# Patient Record
Sex: Male | Born: 1990
Health system: Southern US, Community
[De-identification: ages and names within clinical notes are randomized; demographics above are authoritative.]

## PROBLEM LIST (undated history)

## (undated) DIAGNOSIS — K649 Unspecified hemorrhoids: Secondary | ICD-10-CM

## (undated) HISTORY — PX: OTHER SURGICAL HISTORY: SHX169

## (undated) HISTORY — PX: HIP FRACTURE SURGERY: SHX118

## (undated) HISTORY — DX: Unspecified hemorrhoids: K64.9

---

## 2010-04-11 ENCOUNTER — Encounter: Admission: RE | Admit: 2010-04-11 | Discharge: 2010-04-11 | Payer: Self-pay | Admitting: Emergency Medicine

## 2017-02-22 DIAGNOSIS — S86892A Other injury of other muscle(s) and tendon(s) at lower leg level, left leg, initial encounter: Secondary | ICD-10-CM | POA: Diagnosis not present

## 2017-02-27 ENCOUNTER — Encounter: Payer: Self-pay | Admitting: *Deleted

## 2017-02-27 ENCOUNTER — Telehealth: Payer: Self-pay | Admitting: Physician Assistant

## 2017-02-27 ENCOUNTER — Telehealth: Payer: Self-pay | Admitting: *Deleted

## 2017-02-27 NOTE — Telephone Encounter (Signed)
Made in error

## 2017-02-27 NOTE — Progress Notes (Addendum)
Joseph Burns is a 26 y.o. male here for a new problem.     History of Present Illness:   Clovis Cao, CMA acted as a Neurosurgeon for [employer], [place].  Chief Complaint  Patient presents with  . Establish Care    Blue Cross Blue Shield    Acute Concerns: Elevated blood pressure -- when checking at [facility] several years ago this was normal but reports that his arms got big from working out and they no longer fit into the machine so he doesn't check it anymore, denies swelling in legs, does get frequent headaches since high school (Excedrin Migraine relieves this), no chest pain or SOB Lower back pain -- intermittent, has been going on for some years, bending over can trigger some pain, does do a good portion of back exercises at the gym, denies any numbness or tingling sensation, no issues with bowel movements, the pain does not last long enough for him to have to take anything for the pain Testicle pain -- has been going on for a few years, has a "weird sensation" after ejaculation and intermittent discomfort, no blood or discharge, has been tested for multiple STD's -- all of which have been negative (I reviewed these records from the health department, negative HIV, syphilis, trich, gonorrhea, chlamydia) Ear pressure -- has had some intermittent ear pressure that feels like the "pressure is changing" causes some intermittent discomfort, does not take anything for this, consistently happening daily  Chronic Issues: None  Health Maintenance: Immunizations -- Tdap due today, he is agreeable to this Colonoscopy -- never had, no family hx of colon cancer PSA -- never performed Diet -- tried to eat healthy but it got too expensive, in the past few months he is now eating out more often Caffeine intake -- 1-2 cans per week of caffeinated soda, no coffee or energy drinks Sleep habits -- difficulty falling sleep, gets between 6-7 hours of sleep Exercise -- goes 5-6 times a week to gym, does cardio  and weights Weight -- Weight: 199 lb 3.2 oz (90.4 kg) -- normal Mood -- good   Depression screen PHQ 2/9 02/28/2017  Decreased Interest 0  Down, Depressed, Hopeless 0  PHQ - 2 Score 0   Other providers/specialists: Saw a podiatrist when he was much younger -- has a "low arch" and needed customized insoles when he was in middle or high school  PMHx, SurgHx, SocialHx, Medications, and Allergies were reviewed in the Visit Navigator and updated as appropriate.  Current Medications:   Current Outpatient Prescriptions:  .  fluticasone (FLONASE) 50 MCG/ACT nasal spray, Place 2 sprays into both nostrils daily., Disp: 16 g, Rfl: 1   Review of Systems:   Review of Systems  Constitutional: Negative for chills, fever and weight loss.  HENT: Negative for hearing loss, sinus pain and sore throat.   Eyes: Negative for blurred vision.  Respiratory: Negative for cough and shortness of breath.   Cardiovascular: Negative for chest pain, palpitations and leg swelling.  Gastrointestinal: Negative for blood in stool, constipation, diarrhea, heartburn, nausea and vomiting.  Genitourinary: Negative for dysuria, frequency, hematuria and urgency.       Testicle discomfort  Musculoskeletal: Positive for back pain. Negative for myalgias.  Skin: Negative for rash.  Neurological: Positive for headaches. Negative for dizziness, tingling, sensory change and loss of consciousness.  Endo/Heme/Allergies: Does not bruise/bleed easily.  Psychiatric/Behavioral:       No depression or anxiety    Vitals:   Vitals:  02/28/17 0825 02/28/17 0909  BP: (!) 140/110 (!) 140/100  Pulse: 71   Temp: 97.4 F (36.3 C)   TempSrc: Oral   SpO2: 99%   Weight: 199 lb 3.2 oz (90.4 kg)   Height:  (1.676 m)      Body mass index is 32.15 kg/m.  Physical Exam:   Physical Exam  Constitutional: He appears well-developed. He is cooperative.  Non-toxic appearance. He does not have a sickly appearance. He does not appear  ill. No distress.  HENT:  Head: Normocephalic and atraumatic.  Right Ear: Tympanic membrane normal. Tympanic membrane is not erythematous, not retracted and not bulging.  Left Ear: Tympanic membrane normal. Tympanic membrane is not erythematous, not retracted and not bulging.  Nose: Nose normal.  Mouth/Throat: Uvula is midline and oropharynx is clear and moist.  Eyes: Conjunctivae, EOM and lids are normal. Pupils are equal, round, and reactive to light.  Neck: Trachea normal.  Cardiovascular: Normal rate, regular rhythm, S1 normal, S2 normal, normal heart sounds and normal pulses.   Pulmonary/Chest: Effort normal and breath sounds normal.  Abdominal: Normal appearance and bowel sounds are normal. There is no tenderness.  Genitourinary: Testes normal and penis normal. Right testis shows no swelling and no tenderness. Left testis shows no swelling and no tenderness.  Musculoskeletal:       Lumbar back: He exhibits no tenderness, no bony tenderness, no swelling, no edema and no deformity.  No decrease in ROM of back  Lymphadenopathy:    He has no cervical adenopathy.  Neurological: He is alert.  Skin: Skin is warm, dry and intact.  Psychiatric: He has a normal mood and affect. His speech is normal and behavior is normal. Thought content normal.  Nursing note and vitals reviewed.    Assessment and Plan:    Problem List Items Addressed This Visit      Nervous and Auditory   Dysfunction of eustachian tube    Treat with Flonase per orders. Suspect possible underlying seasonal allergies.        Other   Testicular discomfort    Will obtain ultrasound with doppler given duration of symptoms. Patient would like referral to urology if needed. I told him that we will wait on results and decide what to do. He is agreeable to plan. Will obtain UA and STI screening      Relevant Orders   Urine cytology ancillary only   Urinalysis, Routine w reflex microscopic   [place] Art/Ven Flow Abd Pelv  Doppler   US Scrotum   Elevated blood pressure reading    Follow-up with [place] in 2 weeks for blood pressure re-check. Lipid panel today.      Chronic bilateral low back pain without sciatica    Exam benign. No red flags. Will get lumbar xray to evaluate given duration of symptoms. Consider referral to sports med if patient desires further work up.      Relevant Orders   DG Lumbar Spine 2-3 Views (Completed)   Class 1 obesity due to excess calories with body mass index (BMI) of 32.0 to 32.9 in adult    Discussed healthy diet. Continue exercise and adequate hydration of non-calorie beverages.       Other Visit Diagnoses    Encounter for routine adult medical examination    -  Primary Today patient counseled on age appropriate routine health concerns for screening and prevention, each reviewed and up to date or declined. Immunizations reviewed and up to date or declined.  Labs ordered and reviewed. Risk factors for depression reviewed and negative. Hearing function and visual acuity are intact. ADLs screened and addressed as needed. Functional ability and level of safety reviewed and appropriate. Education, counseling and referrals performed based on assessed risks today. Patient provided with a copy of personalized plan for preventive services.   Relevant Orders   Comprehensive metabolic panel   CBC with Differential/Platelet   Lipid panel   Need for Tdap vaccination       Relevant Orders   Tdap vaccine greater than or equal to 7yo IM (Completed)      . Reviewed expectations re: course of current medical issues. . Discussed self-management of symptoms. . Outlined signs and symptoms indicating need for more acute intervention. . Patient verbalized understanding and all questions were answered. . See orders for this visit as documented in the electronic medical record. . Patient received an After-Visit Summary.  CMA served as Neurosurgeon during this visit. History, Physical, and Plan  performed by medical provider. Documentation and orders reviewed and attested to.   Jarold Motto, PA-C

## 2017-02-27 NOTE — Progress Notes (Signed)
Pre visit review using our clinic review tool, if applicable. No additional management support is needed unless otherwise documented below in the visit note. 

## 2017-02-27 NOTE — Telephone Encounter (Signed)
PreVisit Call completed. No acute complaints. Believes last Tdap was 2008. Did not receive flu vaccine.

## 2017-02-28 ENCOUNTER — Ambulatory Visit (INDEPENDENT_AMBULATORY_CARE_PROVIDER_SITE_OTHER): Payer: BLUE CROSS/BLUE SHIELD | Admitting: Physician Assistant

## 2017-02-28 ENCOUNTER — Other Ambulatory Visit (HOSPITAL_COMMUNITY)
Admission: RE | Admit: 2017-02-28 | Discharge: 2017-02-28 | Disposition: A | Discharge status: Home / Self Care | Payer: BLUE CROSS/BLUE SHIELD | Source: Ambulatory Visit | Attending: Physician Assistant | Admitting: Physician Assistant

## 2017-02-28 ENCOUNTER — Encounter: Payer: Self-pay | Admitting: Physician Assistant

## 2017-02-28 ENCOUNTER — Ambulatory Visit (INDEPENDENT_AMBULATORY_CARE_PROVIDER_SITE_OTHER): Payer: BLUE CROSS/BLUE SHIELD

## 2017-02-28 VITALS — BP 140/100 | HR 71 | Temp 97.4°F | Ht 66.0 in | Wt 199.2 lb

## 2017-02-28 DIAGNOSIS — N50819 Testicular pain, unspecified: Secondary | ICD-10-CM | POA: Diagnosis not present

## 2017-02-28 DIAGNOSIS — G8929 Other chronic pain: Secondary | ICD-10-CM

## 2017-02-28 DIAGNOSIS — R030 Elevated blood-pressure reading, without diagnosis of hypertension: Secondary | ICD-10-CM

## 2017-02-28 DIAGNOSIS — Z113 Encounter for screening for infections with a predominantly sexual mode of transmission: Secondary | ICD-10-CM | POA: Diagnosis not present

## 2017-02-28 DIAGNOSIS — M545 Low back pain, unspecified: Secondary | ICD-10-CM | POA: Insufficient documentation

## 2017-02-28 DIAGNOSIS — Z0000 Encounter for general adult medical examination without abnormal findings: Secondary | ICD-10-CM | POA: Diagnosis not present

## 2017-02-28 DIAGNOSIS — Z6832 Body mass index (BMI) 32.0-32.9, adult: Secondary | ICD-10-CM | POA: Diagnosis not present

## 2017-02-28 DIAGNOSIS — H6980 Other specified disorders of Eustachian tube, unspecified ear: Secondary | ICD-10-CM | POA: Diagnosis not present

## 2017-02-28 DIAGNOSIS — Z23 Encounter for immunization: Secondary | ICD-10-CM | POA: Diagnosis not present

## 2017-02-28 DIAGNOSIS — E6609 Other obesity due to excess calories: Secondary | ICD-10-CM

## 2017-02-28 DIAGNOSIS — M48061 Spinal stenosis, lumbar region without neurogenic claudication: Secondary | ICD-10-CM | POA: Diagnosis not present

## 2017-02-28 LAB — COMPREHENSIVE METABOLIC PANEL
ALT: 31 U/L (ref 0–53)
AST: 28 U/L (ref 0–37)
Albumin: 4.8 g/dL (ref 3.5–5.2)
Alkaline Phosphatase: 52 U/L (ref 39–117)
BUN: 17 mg/dL (ref 6–23)
CO2: 29 mEq/L (ref 19–32)
Calcium: 9.4 mg/dL (ref 8.4–10.5)
Chloride: 104 mEq/L (ref 96–112)
Creatinine, Ser: 1.02 mg/dL (ref 0.40–1.50)
GFR: 112.1 mL/min (ref >60.00)
Glucose, Bld: 105 mg/dL — ABNORMAL HIGH (ref 70–99)
Potassium: 3.8 mEq/L (ref 3.5–5.1)
Sodium: 138 mEq/L (ref 135–145)
Total Bilirubin: 0.5 mg/dL (ref 0.2–1.2)
Total Protein: 7.9 g/dL (ref 6.0–8.3)

## 2017-02-28 LAB — CBC WITH DIFFERENTIAL/PLATELET
Basophils Absolute: 0 10*3/uL (ref 0.0–0.1)
Basophils Relative: 0.8 % (ref 0.0–3.0)
Eosinophils Absolute: 0.2 10*3/uL (ref 0.0–0.7)
Eosinophils Relative: 3.8 % (ref 0.0–5.0)
HCT: 46.6 % (ref 39.0–52.0)
Hemoglobin: 15.4 g/dL (ref 13.0–17.0)
Lymphocytes Relative: 30.5 % (ref 12.0–46.0)
Lymphs Abs: 1.7 10*3/uL (ref 0.7–4.0)
MCHC: 33 g/dL (ref 30.0–36.0)
MCV: 81.8 fl (ref 78.0–100.0)
Monocytes Absolute: 0.6 10*3/uL (ref 0.1–1.0)
Monocytes Relative: 11.4 % (ref 3.0–12.0)
Neutro Abs: 3 10*3/uL (ref 1.4–7.7)
Neutrophils Relative %: 53.5 % (ref 43.0–77.0)
Platelets: 177 10*3/uL (ref 150.0–400.0)
RBC: 5.7 Mil/uL (ref 4.22–5.81)
RDW: 14.1 % (ref 11.5–15.5)
WBC: 5.5 10*3/uL (ref 4.0–10.5)

## 2017-02-28 LAB — URINALYSIS, ROUTINE W REFLEX MICROSCOPIC
Bilirubin Urine: NEGATIVE
Hgb urine dipstick: NEGATIVE
Ketones, ur: NEGATIVE
Leukocytes, UA: NEGATIVE
Nitrite: NEGATIVE
RBC / HPF: NONE SEEN (ref 0–?)
Specific Gravity, Urine: >=1.03 — AB (ref 1.000–1.030)
Total Protein, Urine: NEGATIVE
Urine Glucose: NEGATIVE
Urobilinogen, UA: 0.2 (ref 0.0–1.0)
WBC, UA: NONE SEEN (ref 0–?)
pH: 5.5 (ref 5.0–8.0)

## 2017-02-28 LAB — LIPID PANEL
Cholesterol: 175 mg/dL (ref 0–200)
HDL: 56.4 mg/dL (ref >39.00)
LDL Cholesterol: 106 mg/dL — ABNORMAL HIGH (ref 0–99)
NonHDL: 118.6
Total CHOL/HDL Ratio: 3
Triglycerides: 63 mg/dL (ref 0.0–149.0)
VLDL: 12.6 mg/dL (ref 0.0–40.0)

## 2017-02-28 MED ORDER — FLUTICASONE PROPIONATE 50 MCG/ACT NA SUSP: 2.0000 | Freq: Every day | NASAL | 1 refills | Status: DC

## 2017-02-28 NOTE — Patient Instructions (Addendum)
It was great meeting you today!  You may use the flonase to help with your ear pressure.   We will call you with your lab, urine and xray results.  You will be contacted about your testicular ultrasound. We will wait for the results before we refer you to urology.   Health Maintenance, Male A healthy lifestyle and preventive care is important for your health and wellness. Ask your health care provider about what schedule of regular examinations is right for you. What should I know about weight and diet?  Eat a Healthy Diet  Eat plenty of vegetables, fruits, whole grains, low-fat dairy products, and lean protein.  Do not eat a lot of foods high in solid fats, added sugars, or salt. Maintain a Healthy Weight  Regular exercise can help you achieve or maintain a healthy weight. You should:  Do at least 150 minutes of exercise each week. The exercise should increase your heart rate and make you sweat (moderate-intensity exercise).  Do strength-training exercises at least twice a week. Watch Your Levels of Cholesterol and Blood Lipids  Have your blood tested for lipids and cholesterol every 5 years starting at 28 years of age. If you are at high risk for heart disease, you should start having your blood tested when you are 28 years old. You may need to have your cholesterol levels checked more often if:  Your lipid or cholesterol levels are high.  You are older than 28 years of age.  You are at high risk for heart disease. What should I know about cancer screening? Many types of cancers can be detected early and may often be prevented. Lung Cancer  You should be screened every year for lung cancer if:  You are a current smoker who has smoked for at least 30 years.  You are a former smoker who has quit within the past 15 years.  Talk to your health care provider about your screening options, when you should start screening, and how often you should be screened. Colorectal  Cancer  Routine colorectal cancer screening usually begins at 28 years of age and should be repeated every 5-10 years until you are 28 years old. You may need to be screened more often if early forms of precancerous polyps or small growths are found. Your health care provider may recommend screening at an earlier age if you have risk factors for colon cancer.  Your health care provider may recommend using home test kits to check for hidden blood in the stool.  A small camera at the end of a tube can be used to examine your colon (sigmoidoscopy or colonoscopy). This checks for the earliest forms of colorectal cancer. Prostate and Testicular Cancer  Depending on your age and overall health, your health care provider may do certain tests to screen for prostate and testicular cancer.  Talk to your health care provider about any symptoms or concerns you have about testicular or prostate cancer. Skin Cancer  Check your skin from head to toe regularly.  Tell your health care provider about any new moles or changes in moles, especially if:  There is a change in a mole's size, shape, or color.  You have a mole that is larger than a pencil eraser.  Always use sunscreen. Apply sunscreen liberally and repeat throughout the day.  Protect yourself by wearing long sleeves, pants, a wide-brimmed hat, and sunglasses when outside. What should I know about heart disease, diabetes, and high blood pressure?  If  you are 35-68 years of age, have your blood pressure checked every 3-5 years. If you are 24 years of age or older, have your blood pressure checked every year. You should have your blood pressure measured twice-once when you are at a hospital or clinic, and once when you are not at a hospital or clinic. Record the average of the two measurements. To check your blood pressure when you are not at a hospital or clinic, you can use:  An automated blood pressure machine at a pharmacy.  A home blood  pressure monitor.  Talk to your health care provider about your target blood pressure.  If you are between 68-75 years old, ask your health care provider if you should take aspirin to prevent heart disease.  Have regular diabetes screenings by checking your fasting blood sugar level.  If you are at a normal weight and have a low risk for diabetes, have this test once every three years after the age of 1.  If you are overweight and have a high risk for diabetes, consider being tested at a younger age or more often.  A one-time screening for abdominal aortic aneurysm (AAA) by ultrasound is recommended for men aged 65-75 years who are current or former smokers. What should I know about preventing infection? Hepatitis B  If you have a higher risk for hepatitis B, you should be screened for this virus. Talk with your health care provider to find out if you are at risk for hepatitis B infection. Hepatitis C  Blood testing is recommended for:  Everyone born from [birth year] through 1967.  Anyone with known risk factors for hepatitis C. Sexually Transmitted Diseases (STDs)  You should be screened each year for STDs including gonorrhea and chlamydia if:  You are sexually active and are younger than 28 years of age.  You are older than 28 years of age and your health care provider tells you that you are at risk for this type of infection.  Your sexual activity has changed since you were last screened and you are at an increased risk for chlamydia or gonorrhea. Ask your health care provider if you are at risk.  Talk with your health care provider about whether you are at high risk of being infected with HIV. Your health care provider may recommend a prescription medicine to help prevent HIV infection. What else can I do?  =Schedule regular health, dental, and eye exams.  Stay current with your vaccines (immunizations).  Do not use any tobacco products, such as cigarettes, chewing tobacco, and  e-cigarettes. If you need help quitting, ask your health care provider.  Limit alcohol intake to no more than 2 drinks per day. One drink equals 12 ounces of beer, 5 ounces of wine, or 1 ounces of hard liquor.  Do not use street drugs.  Do not share needles.  Ask your health care provider for help if you need support or information about quitting drugs.  Tell your health care provider if you often feel depressed.  Tell your health care provider if you have ever been abused or do not feel safe at home. This information is not intended to replace advice given to you by your health care provider. Make sure you discuss any questions you have with your health care provider. Document Released: 05/10/2008 Document Revised: 07/11/2016 Document Reviewed: 08/16/2015 Elsevier Interactive Patient Education  2017 [employer]   Eustachian Tube Dysfunction The eustachian tube connects the middle ear to the back of  the nose. It regulates air pressure in the middle ear by allowing air to move between the ear and nose. It also helps to drain fluid from the middle ear space. When the eustachian tube does not function properly, air pressure, fluid, or both can build up in the middle ear. Eustachian tube dysfunction can affect one or both ears. What are the causes? This condition happens when the eustachian tube becomes blocked or cannot open normally. This may result from:  Ear infections.  Colds and other upper respiratory infections.  Allergies.  Irritation, such as from cigarette smoke or acid from the stomach coming up into the esophagus (gastroesophageal reflux).  Sudden changes in air pressure, such as from descending in an airplane.  Abnormal growths in the nose or throat, such as nasal polyps, tumors, or enlarged tissue at the back of the throat (adenoids). What increases the risk? This condition may be more likely to develop in people who smoke and people who are overweight. Eustachian  tube dysfunction may also be more likely to develop in children, especially children who have:  Certain birth defects of the mouth, such as cleft palate.  Large tonsils and adenoids. What are the signs or symptoms? Symptoms of this condition may include:  A feeling of fullness in the ear.  Ear pain.  Clicking or popping noises in the ear.  Ringing in the ear.  Hearing loss.  Loss of balance. Symptoms may get worse when the air pressure around you changes, such as when you travel to an area of high elevation or fly on an airplane. How is this diagnosed? This condition may be diagnosed based on:  Your symptoms.  A physical exam of your ear, nose, and throat.  Tests, such as those that measure:  The movement of your eardrum (tympanogram).  Your hearing (audiometry). How is this treated? Treatment depends on the cause and severity of your condition. If your symptoms are mild, you may be able to relieve your symptoms by moving air into ("popping") your ears. If you have symptoms of fluid in your ears, treatment may include:  Decongestants.  Antihistamines.  Nasal sprays or ear drops that contain medicines that reduce swelling (steroids). In some cases, you may need to have a procedure to drain the fluid in your eardrum (myringotomy). In this procedure, a small tube is placed in the eardrum to:  Drain the fluid.  Restore the air in the middle ear space. Follow these instructions at home:  Take over-the-counter and prescription medicines only as told by your health care provider.  Use techniques to help pop your ears as recommended by your health care provider. These may include:  Chewing gum.  Yawning.  Frequent, forceful swallowing.  Closing your mouth, holding your nose closed, and gently blowing as if you are trying to blow air out of your nose.  Do not do any of the following until your health care provider approves:  Travel to high altitudes.  Fly in  airplanes.  Work in a Estate agent or room.  Scuba dive.  Keep your ears dry. Dry your ears completely after showering or bathing.  Do not smoke.  Keep all follow-up visits as told by your health care provider. This is important. Contact a health care provider if:  Your symptoms do not go away after treatment.  Your symptoms come back after treatment.  You are unable to pop your ears.  You have:  A fever.  Pain in your ear.  Pain in  your head or neck.  Fluid draining from your ear.  Your hearing suddenly changes.  You become very dizzy.  You lose your balance. This information is not intended to replace advice given to you by your health care provider. Make sure you discuss any questions you have with your health care provider. Document Released: 12/09/2015 Document Revised: 04/19/2016 Document Reviewed: 12/01/2014 Elsevier Interactive Patient Education  2017 [employer].

## 2017-02-28 NOTE — Assessment & Plan Note (Signed)
Treat with Flonase per orders. Suspect possible underlying seasonal allergies.

## 2017-02-28 NOTE — Assessment & Plan Note (Signed)
Discussed healthy diet. Continue exercise and adequate hydration of non-calorie beverages.

## 2017-02-28 NOTE — Assessment & Plan Note (Signed)
Follow-up with [place] in 2 weeks for blood pressure re-check.

## 2017-02-28 NOTE — Assessment & Plan Note (Addendum)
Will obtain ultrasound with doppler given duration of symptoms. Patient would like referral to urology if needed. I told him that we will wait on results and decide what to do. He is agreeable to plan. Will obtain UA and STI screening

## 2017-02-28 NOTE — Assessment & Plan Note (Signed)
Exam benign. No red flags. Will get lumbar xray to evaluate given duration of symptoms. Consider referral to sports med if patient desires further work up.

## 2017-03-01 ENCOUNTER — Telehealth: Payer: Self-pay | Admitting: Physician Assistant

## 2017-03-01 LAB — URINE CYTOLOGY ANCILLARY ONLY
Chlamydia: NEGATIVE
Neisseria Gonorrhea: NEGATIVE
Trichomonas: NEGATIVE

## 2017-03-01 NOTE — Telephone Encounter (Signed)
Patient tried returning phone call. Informed you would give a return call back at your convenience.

## 2017-03-04 NOTE — Telephone Encounter (Signed)
Pt aware of results, see result note. 

## 2017-03-05 ENCOUNTER — Ambulatory Visit
Admission: RE | Admit: 2017-03-05 | Discharge: 2017-03-05 | Disposition: A | Discharge status: Home / Self Care | Payer: BLUE CROSS/BLUE SHIELD | Source: Ambulatory Visit | Attending: Physician Assistant | Admitting: Physician Assistant

## 2017-03-05 DIAGNOSIS — N5089 Other specified disorders of the male genital organs: Secondary | ICD-10-CM | POA: Diagnosis not present

## 2017-03-05 DIAGNOSIS — N50819 Testicular pain, unspecified: Secondary | ICD-10-CM

## 2017-03-07 ENCOUNTER — Other Ambulatory Visit: Payer: Self-pay | Admitting: Physician Assistant

## 2017-03-07 DIAGNOSIS — N50819 Testicular pain, unspecified: Secondary | ICD-10-CM

## 2017-03-07 NOTE — Progress Notes (Signed)
Order for referral to Urology placed.

## 2017-03-13 ENCOUNTER — Other Ambulatory Visit: Payer: Self-pay

## 2017-03-14 ENCOUNTER — Encounter: Payer: Self-pay | Admitting: Physician Assistant

## 2017-03-14 ENCOUNTER — Ambulatory Visit (INDEPENDENT_AMBULATORY_CARE_PROVIDER_SITE_OTHER): Payer: BLUE CROSS/BLUE SHIELD | Admitting: Physician Assistant

## 2017-03-14 ENCOUNTER — Encounter: Payer: BLUE CROSS/BLUE SHIELD | Admitting: Physician Assistant

## 2017-03-14 VITALS — BP 140/96 | HR 80 | Temp 98.7°F | Ht 66.0 in | Wt 200.5 lb

## 2017-03-14 DIAGNOSIS — R030 Elevated blood-pressure reading, without diagnosis of hypertension: Secondary | ICD-10-CM | POA: Diagnosis not present

## 2017-03-14 NOTE — Progress Notes (Signed)
Pre visit review using our clinic review tool, if applicable. No additional management support is needed unless otherwise documented below in the visit note. 

## 2017-03-14 NOTE — Progress Notes (Signed)
Joseph Burns is a 26 y.o. male is here to follow up on elevated blood pressure.  I acted as a Neurosurgeon for [employer], PA-C Corky Mull, LPN  History of Present Illness:   Chief Complaint  Patient presents with  . Follow-up  . Hypertension    HPI   Patient reports that he has not been able to check his blood pressure consistently and has no records for me to review. He has started to make some diet changes. He denies frequent HA, SOB, lower leg swelling, blurry vision, or chest pain. He very rarely drinks caffeine. Does have occasional work stress but nothing new or significant.  BP Readings from Last 3 Encounters:  03/14/17 136/90  02/28/17 (!) 140/100     There are no preventive care reminders to display for this patient.  PMHx, SurgHx, SocialHx, FamHx, Medications, and Allergies were reviewed in the Visit Navigator and updated as appropriate.   Patient Active Problem List   Diagnosis Date Noted  . Testicular discomfort 02/28/2017  . Elevated blood pressure reading 02/28/2017  . Chronic bilateral low back pain without sciatica 02/28/2017  . Dysfunction of eustachian tube 02/28/2017  . Class 1 obesity due to excess calories with body mass index (BMI) of 32.0 to 32.9 in adult 02/28/2017    Social History  Substance Use Topics  . Smoking status: Never Smoker  . Smokeless tobacco: Never Used  . Alcohol use Yes     Comment: 6 cans of beer weekly on average    Current Medications and Allergies:    Current Outpatient Prescriptions:  .  fluticasone (FLONASE) 50 MCG/ACT nasal spray, Place 2 sprays into both nostrils daily., Disp: 16 g, Rfl: 1  No Known Allergies  Review of Systems   Review of Systems  Constitutional: Negative.   HENT: Positive for congestion.   Eyes: Negative.   Respiratory: Positive for cough and sputum production.   Cardiovascular: Negative.   Gastrointestinal: Negative.   Genitourinary: Negative.   Musculoskeletal: Negative.     Skin: Negative.   Neurological: Positive for headaches.  Endo/Heme/Allergies: Negative.   Psychiatric/Behavioral: Negative.     Vitals:   Vitals:   03/14/17 1548  BP: 136/90  Pulse: 80  Temp: 98.7 F (37.1 C)  TempSrc: Oral  SpO2: 98%  Weight: 200 lb 8 oz (90.9 kg)  Height:  (1.676 m)     Body mass index is 32.36 kg/m.   Physical Exam:    Physical Exam  Constitutional: He appears well-developed. He is cooperative.  Non-toxic appearance. He does not have a sickly appearance. He does not appear ill. No distress.  Cardiovascular: Normal rate, regular rhythm, S1 normal, S2 normal, normal heart sounds and normal pulses.   No LE edema  Pulmonary/Chest: Effort normal and breath sounds normal.  Neurological: He is alert.  Nursing note and vitals reviewed.      Assessment and Plan:    Problem List Items Addressed This Visit      Other   Elevated blood pressure reading - Primary    Patient is reluctant to go on medications. We briefly discussed starting Norvasc 5 mg, however he declined today. He would like to try to eat healthier and work on lifestyle changes for about 6 weeks instead of starting medication. I advised him that I would like for him to keep a record of his BP for [place] to review at follow-up.          . Reviewed expectations re: course  of current medical issues. . Discussed self-management of symptoms. . Outlined signs and symptoms indicating need for more acute intervention. . Patient verbalized understanding and all questions were answered. . See orders for this visit as documented in the electronic medical record. . Patient received an After Visit Summary.  CMA or LPN served as scribe during this visit. History, Physical, and Plan performed by medical provider. Documentation and orders reviewed and attested to.  Jarold Motto, PA-C [place] [place] 03/14/2017  Follow-up: Return in about 6 weeks (around 04/25/2017) for BP  follow-up.

## 2017-03-14 NOTE — Patient Instructions (Signed)
Heart-Healthy Eating Plan °Heart-healthy meal planning includes: °· Limiting unhealthy fats. °· Increasing healthy fats. °· Making other small dietary changes. °You may need to talk with your doctor or a diet specialist (dietitian) to create an eating plan that is right for you. °What types of fat should I choose? °· Choose healthy fats. These include olive oil and canola oil, flaxseeds, walnuts, almonds, and seeds. °· Eat more omega-3 fats. These include salmon, mackerel, sardines, tuna, flaxseed oil, and ground flaxseeds. Try to eat fish at least twice each week. °· Limit saturated fats. °¨ Saturated fats are often found in animal products, such as meats, butter, and cream. °¨ Plant sources of saturated fats include palm oil, palm kernel oil, and coconut oil. °· Avoid foods with partially hydrogenated oils in them. These include stick margarine, some tub margarines, cookies, crackers, and other baked goods. These contain trans fats. °What general guidelines do I need to follow? °· Check food labels carefully. Identify foods with trans fats or high amounts of saturated fat. °· Fill one half of your plate with vegetables and green salads. Eat 4-5 servings of vegetables per day. A serving of vegetables is: °¨ 1 cup of raw leafy vegetables. °¨ ½ cup of raw or cooked cut-up vegetables. °¨ ½ cup of vegetable juice. °· Fill one fourth of your plate with whole grains. Look for the word "whole" as the first word in the ingredient list. °· Fill one fourth of your plate with lean protein foods. °· Eat 4-5 servings of fruit per day. A serving of fruit is: °¨ One medium whole fruit. °¨ ¼ cup of dried fruit. °¨ ½ cup of fresh, frozen, or canned fruit. °¨ ½ cup of 100% fruit juice. °· Eat more foods that contain soluble fiber. These include apples, broccoli, carrots, beans, peas, and barley. Try to get 20-30 g of fiber per day. °· Eat more home-cooked food. Eat less restaurant, buffet, and fast food. °· Limit or avoid  alcohol. °· Limit foods high in starch and sugar. °· Avoid fried foods. °· Avoid frying your food. Try baking, boiling, grilling, or broiling it instead. You can also reduce fat by: °¨ Removing the skin from poultry. °¨ Removing all visible fats from meats. °¨ Skimming the fat off of stews, soups, and gravies before serving them. °¨ Steaming vegetables in water or broth. °· Lose weight if you are overweight. °· Eat 4-5 servings of nuts, legumes, and seeds per week: °¨ One serving of dried beans or legumes equals ½ cup after being cooked. °¨ One serving of nuts equals 1½ ounces. °¨ One serving of seeds equals ½ ounce or one tablespoon. °· You may need to keep track of how much salt or sodium you eat. This is especially true if you have high blood pressure. Talk with your doctor or dietitian to get more information. °What foods can I eat? °Grains  °Breads, including French, white, pita, wheat, raisin, rye, oatmeal, and Italian. Tortillas that are neither fried nor made with lard or trans fat. Low-fat rolls, including hotdog and hamburger buns and English muffins. Biscuits. Muffins. Waffles. Pancakes. Light popcorn. Whole-grain cereals. Flatbread. Melba toast. Pretzels. Breadsticks. Rusks. Low-fat snacks. Low-fat crackers, including oyster, saltine, matzo, graham, animal, and rye. Rice and pasta, including brown rice and pastas that are made with whole wheat. °Vegetables  °All vegetables. °Fruits  °All fruits, but limit coconut. °Meats and Other Protein Sources  °Lean, well-trimmed beef, veal, pork, and lamb. Chicken and turkey without skin. All   fish and shellfish. Wild duck, rabbit, pheasant, and venison. Egg whites or low-cholesterol egg substitutes. Dried beans, peas, lentils, and tofu. Seeds and most nuts. °Dairy  °Low-fat or nonfat cheeses, including ricotta, string, and mozzarella. Skim or 1% milk that is liquid, powdered, or evaporated. Buttermilk that is made with low-fat milk. Nonfat or low-fat  yogurt. °Beverages  °Mineral water. Diet carbonated beverages. °Sweets and Desserts  °Sherbets and fruit ices. Honey, jam, marmalade, jelly, and syrups. Meringues and gelatins. Pure sugar candy, such as hard candy, jelly beans, gumdrops, mints, marshmallows, and small amounts of dark chocolate. Angel food cake. °Eat all sweets and desserts in moderation. °Fats and Oils  °Nonhydrogenated (trans-free) margarines. Vegetable oils, including soybean, sesame, sunflower, olive, peanut, safflower, corn, canola, and cottonseed. Salad dressings or mayonnaise made with a vegetable oil. Limit added fats and oils that you use for cooking, baking, salads, and as spreads. °Other  °Cocoa powder. Coffee and tea. All seasonings and condiments. °The items listed above may not be a complete list of recommended foods or beverages. Contact your dietitian for more options.  °What foods are not recommended? °Grains  °Breads that are made with saturated or trans fats, oils, or whole milk. Croissants. Butter rolls. Cheese breads. Sweet rolls. Donuts. Buttered popcorn. Chow mein noodles. High-fat crackers, such as cheese or butter crackers. °Meats and Other Protein Sources  °Fatty meats, such as hotdogs, short ribs, sausage, spareribs, bacon, rib eye roast or steak, and mutton. High-fat deli meats, such as salami and bologna. Caviar. Domestic duck and goose. Organ meats, such as kidney, liver, sweetbreads, and heart. °Dairy  °Cream, sour cream, cream cheese, and creamed cottage cheese. Whole-milk cheeses, including blue (bleu), Monterey Jack, Brie, [place], American, Havarti, Swiss, cheddar, Camembert, and [place]. Whole or 2% milk that is liquid, evaporated, or condensed. Whole buttermilk. Cream sauce or high-fat cheese sauce. Yogurt that is made from whole milk. °Beverages  °Regular sodas and juice drinks with added sugar. °Sweets and Desserts  °Frosting. Pudding. Cookies. Cakes other than angel food cake. Candy that has milk chocolate or  white chocolate, hydrogenated fat, butter, coconut, or unknown ingredients. Buttered syrups. Full-fat ice cream or ice cream drinks. °Fats and Oils  °Gravy that has suet, meat fat, or shortening. Cocoa butter, hydrogenated oils, palm oil, coconut oil, palm kernel oil. These can often be found in baked products, candy, fried foods, nondairy creamers, and whipped toppings. Solid fats and shortenings, including bacon fat, salt pork, lard, and butter. Nondairy cream substitutes, such as coffee creamers and sour cream substitutes. Salad dressings that are made of unknown oils, cheese, or sour cream. °The items listed above may not be a complete list of foods and beverages to avoid. Contact your dietitian for more information.  °This information is not intended to replace advice given to you by your health care provider. Make sure you discuss any questions you have with your health care provider. °Document Released: 05/13/2012 Document Revised: 04/19/2016 Document Reviewed: 05/06/2014 °Elsevier Interactive Patient Education © 2017 Else[employer] Inc. ° °

## 2017-03-14 NOTE — Assessment & Plan Note (Signed)
Patient is reluctant to go on medications. We briefly discussed starting Norvasc 5 mg, however he declined today. He would like to try to eat healthier and work on lifestyle changes for about 6 weeks instead of starting medication. I advised him that I would like for him to keep a record of his BP for [place] to review at follow-up.

## 2017-04-24 NOTE — Progress Notes (Deleted)
   Fara OldenKenneth Howatt is a 26 y.o. male is here for 6 week follow up on Elevated blood pressure.  I acted as a Neurosurgeonscribe for [employer] CorporationSamantha Worley, PA-C [employer] Avabella Wailes, LPN  History of Present Illness:   No chief complaint on file.   HPI  There are no preventive care reminders to display for this patient.  PMHx, SurgHx, SocialHx, FamHx, Medications, and Allergies were reviewed in the Visit Navigator and updated as appropriate.   Patient Active Problem List   Diagnosis Date Noted  . Testicular discomfort 02/28/2017  . Elevated blood pressure reading 02/28/2017  . Chronic bilateral low back pain without sciatica 02/28/2017  . Dysfunction of eustachian tube 02/28/2017  . Class 1 obesity due to excess calories with body mass index (BMI) of 32.0 to 32.9 in adult 02/28/2017    Social History  Substance Use Topics  . Smoking status: Never Smoker  . Smokeless tobacco: Never Used  . Alcohol use Yes     Comment: 6 cans of beer weekly on average    Current Medications and Allergies:    Current Outpatient Prescriptions:  .  fluticasone (FLONASE) 50 MCG/ACT nasal spray, Place 2 sprays into both nostrils daily., Disp: 16 g, Rfl: 1  No Known Allergies  Review of Systems   ROS  Vitals:  There were no vitals filed for this visit.   There is no height or weight on file to calculate BMI.   Physical Exam:    Physical Exam   Assessment and Plan:    There are no diagnoses linked to this encounter.  . Reviewed expectations re: course of current medical issues. . Discussed self-management of symptoms. . Outlined signs and symptoms indicating need for more acute intervention. . Patient verbalized understanding and all questions were answered. . See orders for this visit as documented in the electronic medical record. . Patient received an After Visit Summary.  CMA or LPN served as scribe during this visit. History, Physical, and Plan performed by medical provider. Documentation and orders  reviewed and attested to.  Jarold MottoSamantha Worley, PA-C [place], [facility] 04/24/2017  Follow-up: No Follow-up on file.

## 2017-04-25 ENCOUNTER — Ambulatory Visit: Payer: BLUE CROSS/BLUE SHIELD | Admitting: Physician Assistant

## 2017-12-17 IMAGING — US US ART/VEN ABD/PELV/SCROTUM DOPPLER LTD
1 series · 14 of 25 positions shown · non-contrast
Comparison: None.

CLINICAL DATA: Initial evaluation for testicular discomfort for 2-3
years.

EXAM:
ULTRASOUND OF SCROTUM
TECHNIQUE: Complete ultrasound examination of the testicles, epididymis, and
other scrotal structures was performed.

[Series 1: us art/ven abd/pelv/scrotum doppler ltd · 0.08mm/px · 14 of 38 slices shown]
[im 1/38]
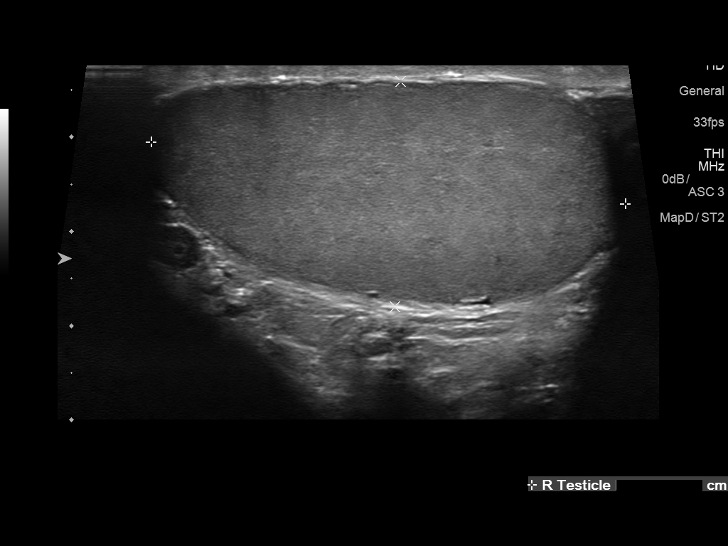
[im 4/38]
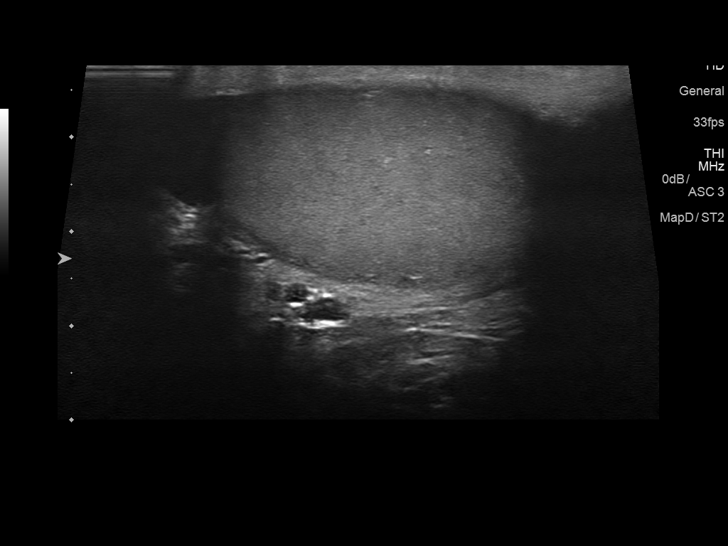
[im 7/38]
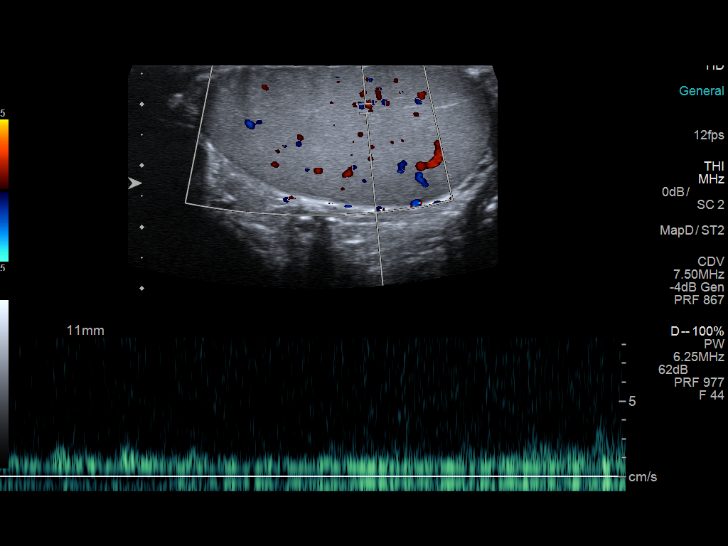
[im 10/38]
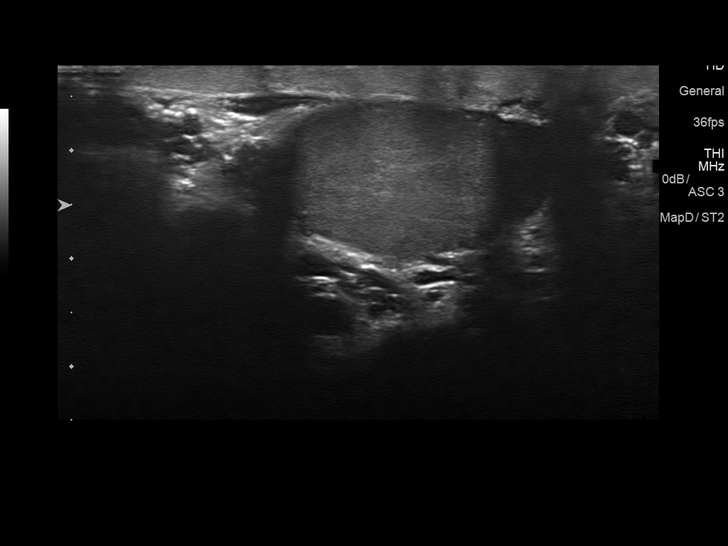
[im 13/38]
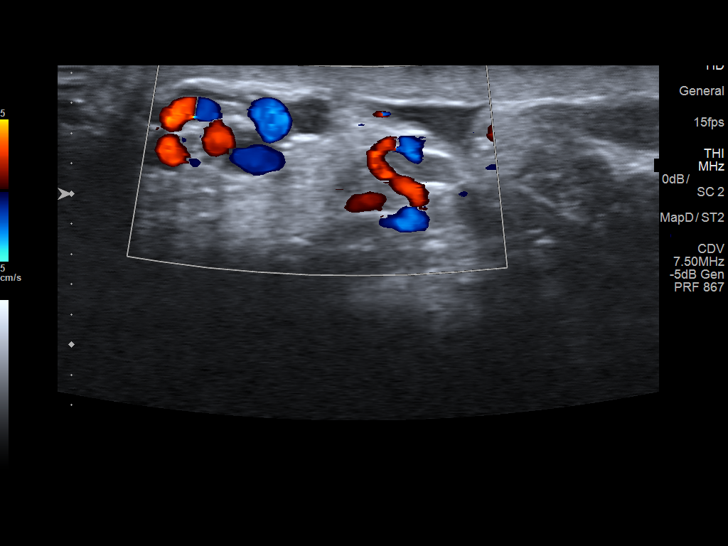
[im 14/38]
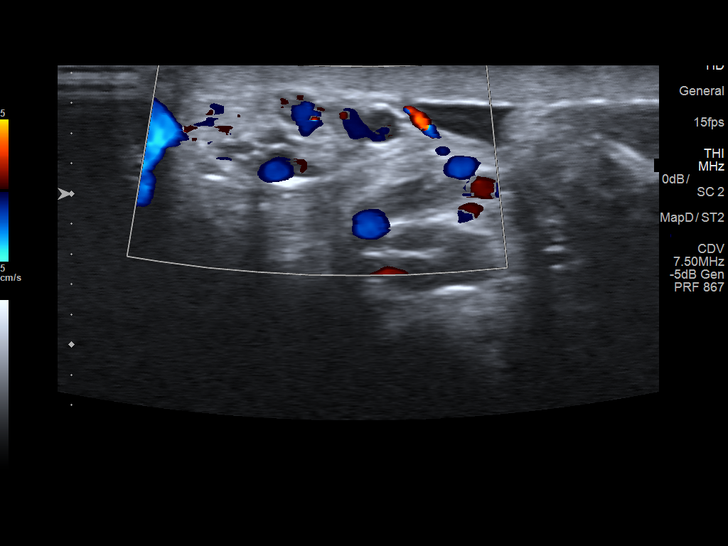
[im 17/38]
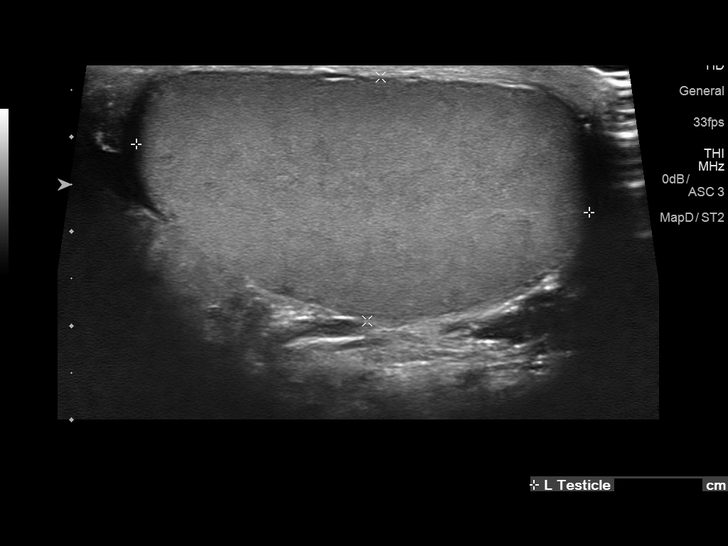
[im 21/38]
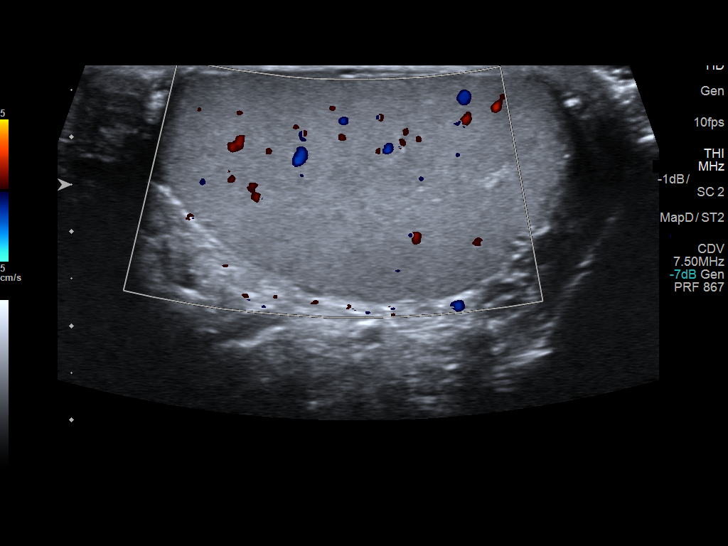
[im 24/38]
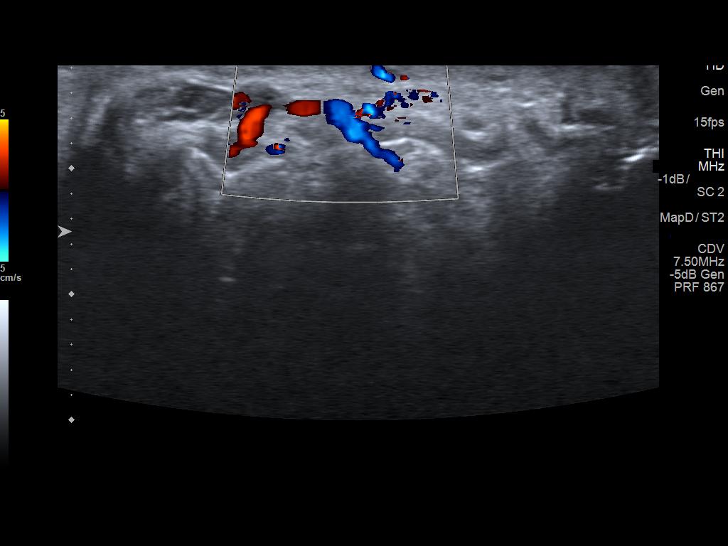
[im 25/38]
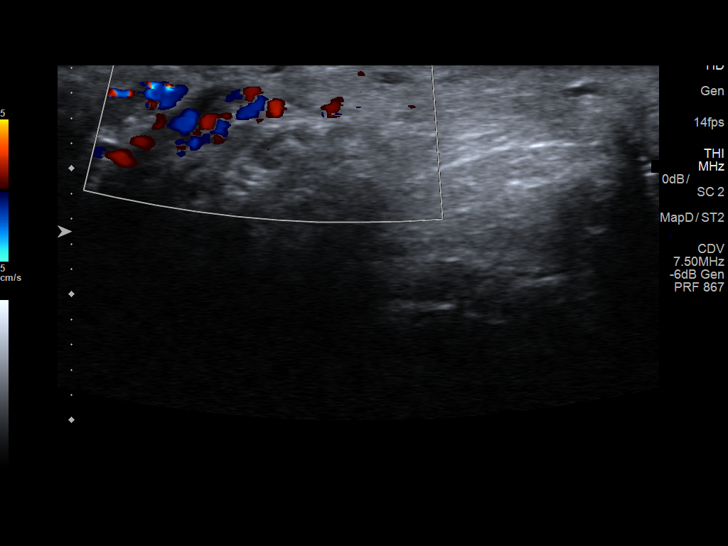
[im 28/38]
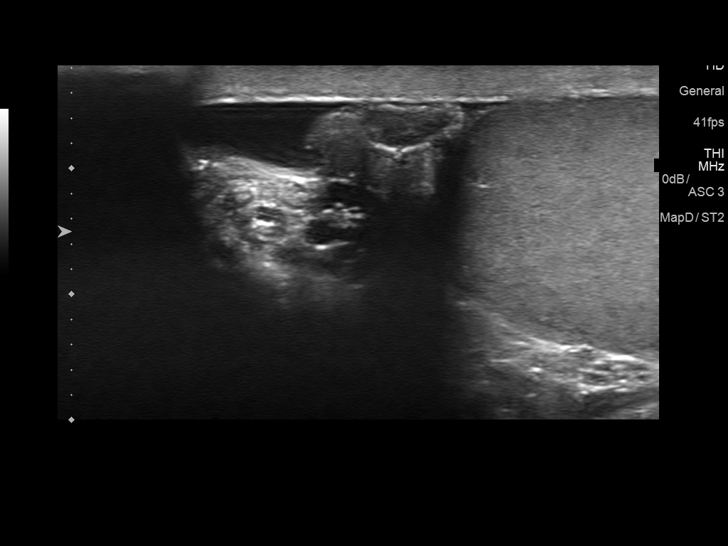
[im 31/38]
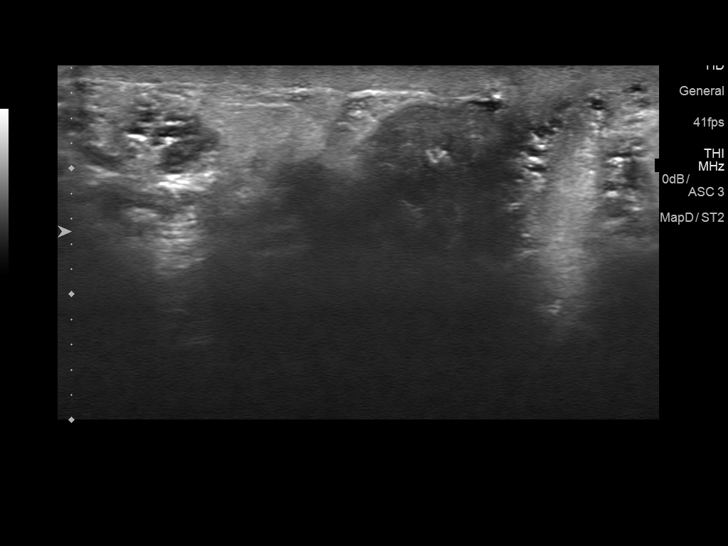
[im 34/38]
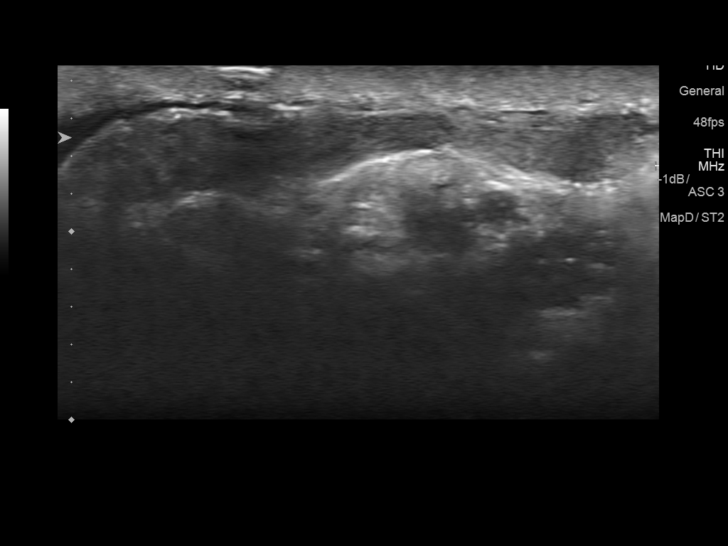
[im 38/38]
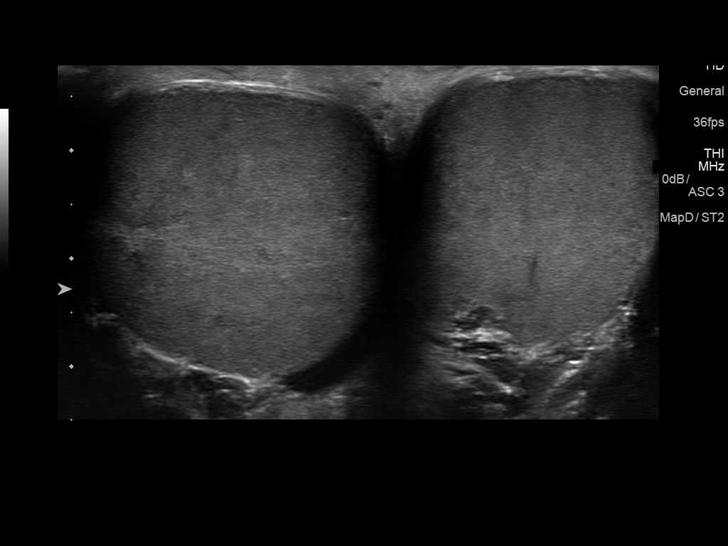

[14 of 25 positions shown; findings below may reference images not displayed]

FINDINGS: Right testicle

Measurements: 5.1 x 2.4 x 3.3 cm. No mass or microlithiasis
visualized.

Left testicle

Measurements: 4.9 x 2.6 x 2.4 cm. No mass or microlithiasis
visualized.

Right epididymis:  Normal in size and appearance.

Left epididymis:  Normal in size and appearance.

Hydrocele:  None visualized.

Varicocele:  None visualized.
IMPRESSION: Normal testicular ultrasound. No findings to explain patient's
symptoms identified.

## 2018-01-06 ENCOUNTER — Telehealth: Payer: Self-pay | Admitting: Physician Assistant

## 2018-01-06 NOTE — Telephone Encounter (Signed)
Called patient and scheduled him for an office visit in 01/08/18.

## 2018-01-06 NOTE — Telephone Encounter (Signed)
Please call patient and let him know that his referral was placed but per our records they tried to contact him but he never returned their phone calls.   Given the fact that have not seen him in the past 6 months, patient would likely need an office visit in order for me to place a referral. This will allow the urologist to have the most up-to-date information.  Jarold MottoSamantha Amere Iott PA-C

## 2018-01-06 NOTE — Telephone Encounter (Signed)
Patient's original referral from April of 2018.  Cancelled after no scheduling for months.  May need to place new referral for it to be sent, along with a new office visit. Please advise.

## 2018-01-06 NOTE — Telephone Encounter (Signed)
Copied from CRM [phone]. Topic: Quick Communication - See Telephone Encounter >> Jan 06, 2018  3:12 PM Cipriano BunkerLambe, Annette S wrote: CRM for notification. See Telephone encounter for:   Pt. Is wanting to [facility] to [facility] regarding an appt. that she was coordinating several months ago for an Insurance underwriterUrologist.  He still has not heard anything  01/06/18.

## 2018-01-06 NOTE — Telephone Encounter (Signed)
See note

## 2018-01-08 ENCOUNTER — Ambulatory Visit: Payer: BLUE CROSS/BLUE SHIELD | Admitting: Physician Assistant

## 2018-01-08 ENCOUNTER — Encounter: Payer: Self-pay | Admitting: Physician Assistant

## 2018-01-08 VITALS — BP 160/100 | HR 69 | Temp 98.2°F | Ht 66.0 in | Wt 202.4 lb

## 2018-01-08 DIAGNOSIS — R030 Elevated blood-pressure reading, without diagnosis of hypertension: Secondary | ICD-10-CM

## 2018-01-08 DIAGNOSIS — N50819 Testicular pain, unspecified: Secondary | ICD-10-CM

## 2018-01-08 DIAGNOSIS — R109 Unspecified abdominal pain: Secondary | ICD-10-CM | POA: Diagnosis not present

## 2018-01-08 MED ORDER — AMLODIPINE BESYLATE 5 MG PO TABS: 5.0000 mg | ORAL_TABLET | Freq: Every day | ORAL | 1 refills | Status: DC

## 2018-01-08 NOTE — Patient Instructions (Addendum)
It was great to see you!  I recommend that you check your blood pressure regularly. I am going to send in Norvasc 5 mg for you to start. I strongly recommend that you consider starting this as your blood pressure was elevated during your last two previous visits.  Please follow-up with me in 2-4 weeks after starting this.  I have put the referral in for urology. If you have not heard anything by the end of next week, please give us a call.  I recommend working on a bland diet. Consider over the counter Zantac for your heartburn, may take 150 mg daily. Also consider taking an over the counter probiotic.  If any symptoms worsen or persist, please let us know!

## 2018-01-08 NOTE — Progress Notes (Signed)
Joseph Burns is a 27 y.o. male is here to discuss:  I acted as a Neurosurgeonscribe for [employer] CorporationSamantha Adedamola Seto, PA-C Corky Mullonna Orphanos, LPN  History of Present Illness:   Chief Complaint  Patient presents with  . Testicle Pain    Testicle Pain  The patient's primary symptoms include testicular pain. This is a chronic problem. The current episode started more than 1 year ago. The problem occurs constantly. The problem has been gradually worsening. The pain is medium. Pertinent negatives include no constipation, diarrhea, discolored urine, dysuria, flank pain, frequency, hesitancy, nausea, painful intercourse, urgency or urinary retention. The testicular pain affects both testicles. There is swelling in both testicles. The color of the testicles is normal. Exacerbated by: sititng for long time. He has tried nothing for the symptoms. He is sexually active. He inconsistently uses condoms. No, his partner does not have an STD.   Upset stomach Patient reports that at night a few times a week for the past month, he has been having an "upset stomach". He does get occasional GERD like symptoms both after eating and on an empty stomach. He denies n/v/d/c. He does mention several times during our visit that his life has been very stressful lately and he has been eating on the go and foods that he normally doesn't. He denies any weight changes.  Wt Readings from Last 3 Encounters:  01/08/18 202 lb 6.1 oz (91.8 kg)  03/14/17 200 lb 8 oz (90.9 kg)  02/28/17 199 lb 3.2 oz (90.4 kg)   Hypertension Patient was seen last April and found to have elevated blood pressure. He was told to follow-up after diet changes as he was reluctant to start medications, however he never followed up. He reports today that he is still very reluctant to start medications. Does not check his blood pressure at home. Patient denies chest pain, SOB, blurred vision, dizziness, unusual headaches, lower leg swelling. Does endorse recent increase in salt  intake and stress at work.  BP Readings from Last 3 Encounters:  01/08/18 (!) 160/100  03/14/17 (!) 140/96  02/28/17 (!) 140/100     There are no preventive care reminders to display for this patient.  History reviewed. No pertinent past medical history.   Social History   Socioeconomic History  . Marital status: Single    Spouse name: Not on file  . Number of children: Not on file  . Years of education: Not on file  . Highest education level: Not on file  Social Needs  . Financial resource strain: Not on file  . Food insecurity - worry: Not on file  . Food insecurity - inability: Not on file  . Transportation needs - medical: Not on file  . Transportation needs - non-medical: Not on file  Occupational History  . Not on file  Tobacco Use  . Smoking status: Never Smoker  . Smokeless tobacco: Never Used  Substance and Sexual Activity  . Alcohol use: Yes    Comment: 6 cans of beer weekly on average  . Drug use: Yes    Frequency: 2.0 times per week    Types: Marijuana  . Sexual activity: Yes    Partners: Female  Other Topics Concern  . Not on file  Social History Narrative   [place] [employer]    [place]   [place]   No children   Fun: movies, games, hanging out with friends    Past Surgical History:  Procedure Laterality Date  . HIP FRACTURE  SURGERY Right    2010  . wrist surgery Left    2006    Family History  Problem Relation Age of Onset  . Diabetes Mother   . Diabetes Maternal Grandmother   . Cancer Maternal Grandfather   . Pancreatic cancer Maternal Grandfather   . Breast cancer Paternal Grandmother   . Cancer Paternal Grandmother        Breast  . Cancer Paternal Grandfather   . Cirrhosis Father        12  . Colon cancer Neg Hx     PMHx, SurgHx, SocialHx, FamHx, Medications, and Allergies were reviewed in the Visit Navigator and updated as appropriate.   Patient Active Problem List   Diagnosis Date Noted  . Testicular discomfort  02/28/2017  . Elevated blood pressure reading 02/28/2017  . Chronic bilateral low back pain without sciatica 02/28/2017  . Dysfunction of eustachian tube 02/28/2017  . Class 1 obesity due to excess calories with body mass index (BMI) of 32.0 to 32.9 in adult 02/28/2017    Social History   Tobacco Use  . Smoking status: Never Smoker  . Smokeless tobacco: Never Used  Substance Use Topics  . Alcohol use: Yes    Comment: 6 cans of beer weekly on average  . Drug use: Yes    Frequency: 2.0 times per week    Types: Marijuana    Current Medications and Allergies:    Current Outpatient Medications:  .  amLODipine (NORVASC) 5 MG tablet, Take 1 tablet (5 mg total) by mouth daily., Disp: 30 tablet, Rfl: 1  No Known Allergies  Review of Systems   Review of Systems  Gastrointestinal: Negative for constipation, diarrhea and nausea.  Genitourinary: Positive for testicular pain. Negative for dysuria, flank pain, frequency, hesitancy and urgency.    Vitals:   Vitals:   01/08/18 0941 01/08/18 1006  BP: (!) 150/110 (!) 160/100  Pulse: 69   Temp: 98.2 F (36.8 C)   TempSrc: Oral   SpO2: 97%   Weight: 202 lb 6.1 oz (91.8 kg)   Height: 5\' 6"  (1.676 m)      Body mass index is 32.66 kg/m.   Physical Exam:    Physical Exam  Constitutional: He appears well-developed. He is cooperative.  Non-toxic appearance. He does not have a sickly appearance. He does not appear ill. No distress.  Cardiovascular: Normal rate, regular rhythm, S1 normal, S2 normal, normal heart sounds and normal pulses.  No LE edema  Pulmonary/Chest: Effort normal and breath sounds normal.  Abdominal: Normal appearance and bowel sounds are normal. There is no tenderness. There is no rigidity and no rebound.  Genitourinary: Penis normal. Right testis shows no swelling and no tenderness. Left testis shows tenderness. Left testis shows no swelling.     Lymphadenopathy: No inguinal adenopathy noted on the right or  left side.  Neurological: He is alert. GCS eye subscore is 4. GCS verbal subscore is 5. GCS motor subscore is 6.  Skin: Skin is warm, dry and intact.  Psychiatric: He has a normal mood and affect. His speech is normal and behavior is normal.  Nursing note and vitals reviewed.    Assessment and Plan:    Joseph Burns was seen today for testicle pain.  Diagnoses and all orders for this visit:  Elevated blood pressure reading Patient remains reluctant to start medications, however I educated on the risks of ongoing uncontrolled blood pressure. Patient verbalized understanding and reports that he will consider medication.  I have  sent in 5mg  Norvasc for him to start, and follow up with me in 2-4 weeks. I recommended that he keep a BP log for [place] and work on diet as able.  Testicular discomfort No red flags on exam. Based on chronicity of problem, patient would like to go to urology and I am in agreement. I have placed a referral. I advised patient to inform us if he hasn't heard anything from the end of next week. Declined STI testing. -     Ambulatory referral to Urology  Stomach discomfort No red flags on exam. Based on patient's history, I suspect that he has underlying GERD, poor diet, and stress that is all contributing to this. I recommended that he work on a bland diet. I also recommended considering Zantac 150 milligrams over-the-counter and probiotic. Follow-up if symptoms persist or worsen.   Other orders -     amLODipine (NORVASC) 5 MG tablet; Take 1 tablet (5 mg total) by mouth daily.    . Reviewed expectations re: course of current medical issues. . Discussed self-management of symptoms. . Outlined signs and symptoms indicating need for more acute intervention. . Patient verbalized understanding and all questions were answered. . See orders for this visit as documented in the electronic medical record. . Patient received an After Visit Summary.  CMA or LPN served as scribe  during this visit. History, Physical, and Plan performed by medical provider. Documentation and orders reviewed and attested to.  Jarold Motto, PA-C [place] 01/08/2018  Follow-up: No Follow-up on file.

## 2018-02-05 ENCOUNTER — Ambulatory Visit: Payer: BLUE CROSS/BLUE SHIELD | Admitting: Physician Assistant

## 2018-02-10 DIAGNOSIS — N50812 Left testicular pain: Secondary | ICD-10-CM | POA: Diagnosis not present

## 2018-02-10 DIAGNOSIS — N50811 Right testicular pain: Secondary | ICD-10-CM | POA: Diagnosis not present

## 2018-02-14 ENCOUNTER — Ambulatory Visit: Payer: BLUE CROSS/BLUE SHIELD | Admitting: Physician Assistant

## 2018-02-21 ENCOUNTER — Ambulatory Visit: Payer: BLUE CROSS/BLUE SHIELD | Admitting: Physician Assistant

## 2018-10-07 ENCOUNTER — Ambulatory Visit (INDEPENDENT_AMBULATORY_CARE_PROVIDER_SITE_OTHER): Payer: BLUE CROSS/BLUE SHIELD | Admitting: Physician Assistant

## 2018-10-07 ENCOUNTER — Other Ambulatory Visit (HOSPITAL_COMMUNITY)
Admission: RE | Admit: 2018-10-07 | Discharge: 2018-10-07 | Disposition: A | Discharge status: Home / Self Care | Payer: BLUE CROSS/BLUE SHIELD | Source: Ambulatory Visit | Attending: Physician Assistant | Admitting: Physician Assistant

## 2018-10-07 ENCOUNTER — Encounter: Payer: Self-pay | Admitting: Physician Assistant

## 2018-10-07 VITALS — BP 124/78 | HR 97 | Temp 98.7°F | Ht 66.0 in | Wt 200.2 lb

## 2018-10-07 DIAGNOSIS — R0789 Other chest pain: Secondary | ICD-10-CM | POA: Diagnosis not present

## 2018-10-07 DIAGNOSIS — F524 Premature ejaculation: Secondary | ICD-10-CM | POA: Diagnosis not present

## 2018-10-07 DIAGNOSIS — Z113 Encounter for screening for infections with a predominantly sexual mode of transmission: Secondary | ICD-10-CM | POA: Insufficient documentation

## 2018-10-07 DIAGNOSIS — N50819 Testicular pain, unspecified: Secondary | ICD-10-CM

## 2018-10-07 DIAGNOSIS — Z13220 Encounter for screening for lipoid disorders: Secondary | ICD-10-CM

## 2018-10-07 DIAGNOSIS — Z0001 Encounter for general adult medical examination with abnormal findings: Secondary | ICD-10-CM | POA: Diagnosis not present

## 2018-10-07 DIAGNOSIS — E669 Obesity, unspecified: Secondary | ICD-10-CM

## 2018-10-07 DIAGNOSIS — R030 Elevated blood-pressure reading, without diagnosis of hypertension: Secondary | ICD-10-CM | POA: Diagnosis not present

## 2018-10-07 DIAGNOSIS — M5136 Other intervertebral disc degeneration, lumbar region: Secondary | ICD-10-CM

## 2018-10-07 NOTE — Patient Instructions (Addendum)
It was great to see you!  Please go to the lab for blood work.   1. You will be contacted by urology for a second opinion 2. Our office will call you with your results unless you have chosen to receive results via MyChart. 3. If your chest symptoms persist or worsen, please seek medical attention.  If anything is abnormal we will treat accordingly and get you in for a follow-up.  Take care,  [facility] Maintenance, Male A healthy lifestyle and preventive care is important for your health and wellness. Ask your health care provider about what schedule of regular examinations is right for you. What should I know about weight and diet? Eat a Healthy Diet  Eat plenty of vegetables, fruits, whole grains, low-fat dairy products, and lean protein.  Do not eat a lot of foods high in solid fats, added sugars, or salt.  Maintain a Healthy Weight Regular exercise can help you achieve or maintain a healthy weight. You should:  Do at least 150 minutes of exercise each week. The exercise should increase your heart rate and make you sweat (moderate-intensity exercise).  Do strength-training exercises at least twice a week.  Watch Your Levels of Cholesterol and Blood Lipids  Have your blood tested for lipids and cholesterol every 5 years starting at 29 years of age. If you are at high risk for heart disease, you should start having your blood tested when you are 29 years old. You may need to have your cholesterol levels checked more often if: ? Your lipid or cholesterol levels are high. ? You are older than 29 years of age. ? You are at high risk for heart disease.  What should I know about cancer screening? Many types of cancers can be detected early and may often be prevented. Lung Cancer  You should be screened every year for lung cancer if: ? You are a current smoker who has smoked for at least 30 years. ? You are a former smoker who has quit within the past 15 years.  Talk  to your health care provider about your screening options, when you should start screening, and how often you should be screened.  Colorectal Cancer  Routine colorectal cancer screening usually begins at 29 years of age and should be repeated every 5-10 years until you are 29 years old. You may need to be screened more often if early forms of precancerous polyps or small growths are found. Your health care provider may recommend screening at an earlier age if you have risk factors for colon cancer.  Your health care provider may recommend using home test kits to check for hidden blood in the stool.  A small camera at the end of a tube can be used to examine your colon (sigmoidoscopy or colonoscopy). This checks for the earliest forms of colorectal cancer.  Prostate and Testicular Cancer  Depending on your age and overall health, your health care provider may do certain tests to screen for prostate and testicular cancer.  Talk to your health care provider about any symptoms or concerns you have about testicular or prostate cancer.  Skin Cancer  Check your skin from head to toe regularly.  Tell your health care provider about any new moles or changes in moles, especially if: ? There is a change in a mole's size, shape, or color. ? You have a mole that is larger than a pencil eraser.  Always use sunscreen. Apply sunscreen liberally and repeat throughout  the day.  Protect yourself by wearing long sleeves, pants, a wide-brimmed hat, and sunglasses when outside.  What should I know about heart disease, diabetes, and high blood pressure?  If you are 7618-29 years of age, have your blood pressure checked every 3-5 years. If you are 29 years of age or older, have your blood pressure checked every year. You should have your blood pressure measured twice-once when you are at a hospital or clinic, and once when you are not at a hospital or clinic. Record the average of the two measurements. To check  your blood pressure when you are not at a hospital or clinic, you can use: ? An automated blood pressure machine at a pharmacy. ? A home blood pressure monitor.  Talk to your health care provider about your target blood pressure.  If you are between 2045-29 years old, ask your health care provider if you should take aspirin to prevent heart disease.  Have regular diabetes screenings by checking your fasting blood sugar level. ? If you are at a normal weight and have a low risk for diabetes, have this test once every three years after the age of 29. ? If you are overweight and have a high risk for diabetes, consider being tested at a younger age or more often.  A one-time screening for abdominal aortic aneurysm (AAA) by ultrasound is recommended for men aged 65-75 years who are current or former smokers. What should I know about preventing infection? Hepatitis B If you have a higher risk for hepatitis B, you should be screened for this virus. Talk with your health care provider to find out if you are at risk for hepatitis B infection. Hepatitis C Blood testing is recommended for:  Everyone born from [birth year] through 1967.  Anyone with known risk factors for hepatitis C.  Sexually Transmitted Diseases (STDs)  You should be screened each year for STDs including gonorrhea and chlamydia if: ? You are sexually active and are younger than 29 years of age. ? You are older than 29 years of age and your health care provider tells you that you are at risk for this type of infection. ? Your sexual activity has changed since you were last screened and you are at an increased risk for chlamydia or gonorrhea. Ask your health care provider if you are at risk.  Talk with your health care provider about whether you are at high risk of being infected with HIV. Your health care provider may recommend a prescription medicine to help prevent HIV infection.  What else can I do?  Schedule regular health, dental,  and eye exams.  Stay current with your vaccines (immunizations).  Do not use any tobacco products, such as cigarettes, chewing tobacco, and e-cigarettes. If you need help quitting, ask your health care provider.  Limit alcohol intake to no more than 2 drinks per day. One drink equals 12 ounces of beer, 5 ounces of wine, or 1 ounces of hard liquor.  Do not use street drugs.  Do not share needles.  Ask your health care provider for help if you need support or information about quitting drugs.  Tell your health care provider if you often feel depressed.  Tell your health care provider if you have ever been abused or do not feel safe at home. This information is not intended to replace advice given to you by your health care provider. Make sure you discuss any questions you have with your health care provider. Document  Released: 05/10/2008 Document Revised: 07/11/2016 Document Reviewed: 08/16/2015 Elsevier Interactive Patient Education  [employer]2018 [employer] Inc[employer]

## 2018-10-07 NOTE — Progress Notes (Signed)
I acted as a Neurosurgeon for [employer], PA-C Corky Mull, LPN  Subjective:    Joseph Burns is a 27 y.o. male and is here for a comprehensive physical exam.  HPI  There are no preventive care reminders to display for this patient.  Acute Concerns: Premature ejaculation -- this patient reports that he has been experiencing premature ejaculation. He mentions this briefly on his way out of the office and doesn't provide more details. Chest discomfort -- when he was younger and more active in sports, he states that he had some intermittent central chest discomfort that would randomly occur. This subsided on its own. About a month ago he had 1 or 2 episodes where he experienced something similar. No specific inciting event. Can occur at rest or with activity. Lasts up to 1 minute. No radiation of pain. No fever, lightheadedness, dizziness, unusual HA.  Chronic Issues: Testicular discomfort -- ongoing. He went to [facility] and states that he was not given a clear explanation for his symptoms.  Has ongoing, constant pain without improvement. Would like referral for second opinion. Mild degenerative disc disease - he has persistent back pain. Was diagnosed with mild arthritis in his lumbar back last year. Has been working on strength and cardio. Denies loss of bowel/bladder control, radiation of pain, persistent numbness or tingling. Elevated BP -- Never started the Norvasc 5 mg. Patient denies SOB, blurred vision, dizziness, unusual headaches, lower leg swelling. Denies excessive caffeine intake, stimulant usage, excessive alcohol intake, or increase in salt consumption.  Health Maintenance: Immunizations -- UTD, declined Flu Diet -- continues to eat fast food occasionally Caffeine intake -- soda rarey Sleep habits -- overall good sleep Exercise -- 3-4 times a week, cardio and strength training Weight -- Weight: 200 lb 3.2 oz (90.8 kg)  Weight history Wt Readings from Last 10  Encounters:  10/07/18 200 lb 3.2 oz (90.8 kg)  01/08/18 202 lb 6.1 oz (91.8 kg)  03/14/17 200 lb 8 oz (90.9 kg)  02/28/17 199 lb 3.2 oz (90.4 kg)  Mood -- denies depression, anxiety or anger Tobacco use -- no use Alcohol use --- no excessive use  Depression screen PHQ 2/9 10/07/2018  Decreased Interest 0  Down, Depressed, Hopeless 0  PHQ - 2 Score 0    Other providers/specialists: UTD with dentist and eye doctor    PMHx, SurgHx, SocialHx, Medications, and Allergies were reviewed in the Visit Navigator and updated as appropriate.   No past medical history on file.   Past Surgical History:  Procedure Laterality Date  . HIP FRACTURE SURGERY Right    2010  . wrist surgery Left    2006     Family History  Problem Relation Age of Onset  . Diabetes Mother   . Hypertension Mother   . Diabetes Maternal Grandmother   . Cancer Maternal Grandfather 50       colon  . Pancreatic cancer Maternal Grandfather   . Breast cancer Paternal Grandmother   . Cancer Paternal Grandfather   . Cirrhosis Father        20  . Stomach cancer Other   . Breast cancer Maternal Aunt   . Colon cancer Neg Hx     Social History   Tobacco Use  . Smoking status: Never Smoker  . Smokeless tobacco: Never Used  Substance Use Topics  . Alcohol use: Yes    Comment: 6 cans of beer weekly on average  . Drug use: Yes    Frequency: 2.0  times per week    Types: Marijuana    Review of Systems:   Review of Systems  Constitutional: Negative.  Negative for chills, fever, malaise/fatigue and weight loss.  HENT: Negative.  Negative for hearing loss, sinus pain and sore throat.   Eyes: Negative.  Negative for blurred vision.  Respiratory: Negative.  Negative for cough and shortness of breath.   Cardiovascular: Positive for chest pain. Negative for palpitations and leg swelling.  Gastrointestinal: Negative.  Negative for abdominal pain, constipation, diarrhea, heartburn, nausea and vomiting.    Genitourinary: Negative.  Negative for dysuria, frequency and urgency.       Testicular pain   Musculoskeletal: Positive for back pain. Negative for myalgias and neck pain.  Skin: Negative.  Negative for itching and rash.  Neurological: Negative.  Negative for dizziness, tingling, seizures, loss of consciousness and headaches.  Endo/Heme/Allergies: Negative.  Negative for polydipsia.  Psychiatric/Behavioral: Negative.  Negative for depression. The patient is not nervous/anxious.     Objective:   Vitals:   10/07/18 1437  BP: 124/78  Pulse: 97  Temp: 98.7 F (37.1 C)  SpO2: 99%   Body mass index is 32.31 kg/m.  General Appearance:  Alert, cooperative, no distress, appears stated age  Head:  Normocephalic, without obvious abnormality, atraumatic  Eyes:  PERRL, conjunctiva/corneas clear, EOM's intact, fundi benign, both eyes       Ears:  Normal TM's and external ear canals, both ears  Nose: Nares normal, septum midline, mucosa normal, no drainage    or sinus tenderness  Throat: Lips, mucosa, and tongue normal; teeth and gums normal  Neck: Supple, symmetrical, trachea midline, no adenopathy; thyroid:  No enlargement/tenderness/nodules; no carotit bruit or JVD  Back:   Symmetric, no curvature, ROM normal, no CVA tenderness  Lungs:   Clear to auscultation bilaterally, respirations unlabored  Chest wall:  No tenderness or deformity  Heart:  Regular rate and rhythm, S1 and S2 normal, no murmur, rub   or gallop  Abdomen:   Soft, non-tender, bowel sounds active all four quadrants, no masses, no organomegaly  Extremities: Extremities normal, atraumatic, no cyanosis or edema  Prostate: Not done.   Skin: Skin color, texture, turgor normal, no rashes or lesions  Lymph nodes: Cervical, supraclavicular, and axillary nodes normal  Neurologic: CNII-XII grossly intact. Normal strength, sensation and reflexes throughout   EKG tracing is personally reviewed.  EKG notes NSR.  No acute changes.    Assessment/Plan:   Joseph Burns was seen today for annual exam.  Diagnoses and all orders for this visit:  Encounter for general adult medical examination with abnormal findings Today patient counseled on age appropriate routine health concerns for screening and prevention, each reviewed and up to date or declined. Immunizations reviewed and up to date or declined. Labs ordered and reviewed. Risk factors for depression reviewed and negative. Hearing function and visual acuity are intact. ADLs screened and addressed as needed. Functional ability and level of safety reviewed and appropriate. Education, counseling and referrals performed based on assessed risks today. Patient provided with a copy of personalized plan for preventive services.  Elevated blood pressure reading Normotensive today. Remain off of Norvasc 5 mg for now. Follow-up as needed. -     CBC w/Diff -     Comprehensive metabolic panel  Chest discomfort No red flags on exam. Not exertional. Suspect possible MSK-related. EKG normal. Follow-up if symptoms worsen or persist. -     EKG 12-Lead  Lipid screening -     Lipid  Profile  Screening examination for STD (sexually transmitted disease) -     Urine cytology ancillary only([place])  Premature ejaculation and Testicular discomfort Refer to urology for second opinion.  Obesity Continues to work on diet and exercise.  DDD (degenerative disc disease), lumbar I discussed that I did not think that another xray was warranted since his symptoms are stable. If they change, I would like to refer to Dr. Berline Chough for further evaluation and treatment.  Well Adult Exam: Labs ordered: Yes. Patient counseling was done. See below for items discussed. Discussed the patient's BMI.  The BMI BMI is not in the acceptable range; BMI management plan is completed Follow up as needed for acute illness.  Patient Counseling: [x]   Nutrition: Stressed importance of moderation in sodium/caffeine  intake, saturated fat and cholesterol, caloric balance, sufficient intake of fresh fruits, vegetables, and fiber.  [x]   Stressed the importance of regular exercise.   []   Substance Abuse: Discussed cessation/primary prevention of tobacco, alcohol, or other drug use; driving or other dangerous activities under the influence; availability of treatment for abuse.   [x]   Injury prevention: Discussed safety belts, safety helmets, smoke detector, smoking near bedding or upholstery.   []   Sexuality: Discussed sexually transmitted diseases, partner selection, use of condoms, avoidance of unintended pregnancy  and contraceptive alternatives.   [x]   Dental health: Discussed importance of regular tooth brushing, flossing, and dental visits.  [x]   Health maintenance and immunizations reviewed. Please refer to Health maintenance section.    I spent 40 minutes with this patient, greater than 50% was face-to-face time counseling regarding the above diagnoses.  Jarold Motto, PA-C [facility]

## 2018-10-08 LAB — CBC WITH DIFFERENTIAL/PLATELET
Basophils Absolute: 0.1 10*3/uL (ref 0.0–0.1)
Basophils Relative: 1 % (ref 0.0–3.0)
Eosinophils Absolute: 0.1 10*3/uL (ref 0.0–0.7)
Eosinophils Relative: 1.9 % (ref 0.0–5.0)
HCT: 47 % (ref 39.0–52.0)
Hemoglobin: 15.5 g/dL (ref 13.0–17.0)
Lymphocytes Relative: 26.4 % (ref 12.0–46.0)
Lymphs Abs: 1.5 10*3/uL (ref 0.7–4.0)
MCHC: 33.1 g/dL (ref 30.0–36.0)
MCV: 83.1 fl (ref 78.0–100.0)
Monocytes Absolute: 0.7 10*3/uL (ref 0.1–1.0)
Monocytes Relative: 12.1 % — ABNORMAL HIGH (ref 3.0–12.0)
Neutro Abs: 3.3 10*3/uL (ref 1.4–7.7)
Neutrophils Relative %: 58.6 % (ref 43.0–77.0)
Platelets: 214 10*3/uL (ref 150.0–400.0)
RBC: 5.65 Mil/uL (ref 4.22–5.81)
RDW: 14 % (ref 11.5–15.5)
WBC: 5.7 10*3/uL (ref 4.0–10.5)

## 2018-10-08 LAB — COMPREHENSIVE METABOLIC PANEL
ALT: 23 U/L (ref 0–53)
AST: 20 U/L (ref 0–37)
Albumin: 4.7 g/dL (ref 3.5–5.2)
Alkaline Phosphatase: 56 U/L (ref 39–117)
BUN: 14 mg/dL (ref 6–23)
CO2: 28 mEq/L (ref 19–32)
Calcium: 9.6 mg/dL (ref 8.4–10.5)
Chloride: 103 mEq/L (ref 96–112)
Creatinine, Ser: 0.97 mg/dL (ref 0.40–1.50)
GFR: 117.44 mL/min (ref >60.00)
Glucose, Bld: 86 mg/dL (ref 70–99)
Potassium: 4.3 mEq/L (ref 3.5–5.1)
Sodium: 139 mEq/L (ref 135–145)
Total Bilirubin: 0.4 mg/dL (ref 0.2–1.2)
Total Protein: 7.5 g/dL (ref 6.0–8.3)

## 2018-10-08 LAB — LIPID PANEL
Cholesterol: 158 mg/dL (ref 0–200)
HDL: 51 mg/dL (ref >39.00)
LDL Cholesterol: 83 mg/dL (ref 0–99)
NonHDL: 107.36
Total CHOL/HDL Ratio: 3
Triglycerides: 122 mg/dL (ref 0.0–149.0)
VLDL: 24.4 mg/dL (ref 0.0–40.0)

## 2018-10-08 LAB — URINE CYTOLOGY ANCILLARY ONLY
Chlamydia: NEGATIVE
Neisseria Gonorrhea: NEGATIVE
Trichomonas: NEGATIVE

## 2018-10-27 ENCOUNTER — Telehealth: Payer: Self-pay | Admitting: Physician Assistant

## 2018-10-27 NOTE — Telephone Encounter (Signed)
Spoke to pt told him calling in regards to message that he missed a call. Told pt I have not called him last thing was lab work and you read results on My Chart. Pt verbalized understanding.

## 2018-10-27 NOTE — Telephone Encounter (Signed)
See note  Copied from CRM [id number]. Topic: General - Other >> Oct 27, 2018 12:26 PM Jaquita Rectoravis, Karen A wrote: Reason for CRM: Patient called to say he had a missed call from office number but nothing noted in chart. Ph# [phone]

## 2018-12-09 DIAGNOSIS — N50812 Left testicular pain: Secondary | ICD-10-CM | POA: Diagnosis not present

## 2018-12-09 DIAGNOSIS — R102 Pelvic and perineal pain: Secondary | ICD-10-CM | POA: Diagnosis not present

## 2018-12-09 DIAGNOSIS — N50811 Right testicular pain: Secondary | ICD-10-CM | POA: Diagnosis not present

## 2019-01-15 DIAGNOSIS — R102 Pelvic and perineal pain: Secondary | ICD-10-CM | POA: Diagnosis not present

## 2019-03-13 ENCOUNTER — Ambulatory Visit: Payer: BLUE CROSS/BLUE SHIELD | Admitting: Physician Assistant

## 2019-03-17 ENCOUNTER — Ambulatory Visit: Payer: Self-pay | Admitting: Physician Assistant

## 2019-03-17 NOTE — Progress Notes (Signed)
Error-patient cancelled appointment 

## 2019-03-18 ENCOUNTER — Encounter: Payer: Self-pay | Admitting: Physician Assistant

## 2019-04-24 DIAGNOSIS — L708 Other acne: Secondary | ICD-10-CM | POA: Insufficient documentation

## 2019-04-24 DIAGNOSIS — H5210 Myopia, unspecified eye: Secondary | ICD-10-CM | POA: Insufficient documentation

## 2019-07-03 ENCOUNTER — Telehealth: Payer: Self-pay

## 2019-07-03 NOTE — Telephone Encounter (Signed)
Please advise on TOC  °

## 2019-07-03 NOTE — Telephone Encounter (Signed)
Copied from [place] [id number]. Topic: Appointment Scheduling - Transfer of Care >> Jul 03, 2019  2:13 PM Sheran Luz wrote: Pt is requesting to transfer FROM: Joseph Coke, PA  Pt is requesting to transfer TO: Dr. Volanda Napoleon ([place])  Reason for requested transfer: Location   Send CRM to patient's current PCP (transferring FROM).

## 2019-07-03 NOTE — Telephone Encounter (Signed)
Ok with me 

## 2019-07-05 NOTE — Telephone Encounter (Signed)
Ok

## 2019-07-06 NOTE — Telephone Encounter (Signed)
I have this patient scheduled for 07/15/2019 with Dr. Volanda Napoleon

## 2019-07-06 NOTE — Telephone Encounter (Signed)
Pt calling to check on the TOC status and see when he can schedule with Dr. Volanda Napoleon.  Let pt know the TOC did go through and that he would be getting a call to schedule.  Tried office, but no answer.

## 2019-07-06 NOTE — Telephone Encounter (Signed)
Please call and schedule TOC appointment with Dr. Volanda Napoleon.

## 2019-07-15 ENCOUNTER — Encounter: Payer: Self-pay | Admitting: Family Medicine

## 2019-07-30 ENCOUNTER — Encounter: Payer: Self-pay | Admitting: Family Medicine

## 2019-07-30 ENCOUNTER — Other Ambulatory Visit: Payer: Self-pay

## 2019-07-30 ENCOUNTER — Ambulatory Visit: Payer: BC Managed Care – PPO | Admitting: Family Medicine

## 2019-07-30 VITALS — BP 118/69 | Temp 96.4°F | Wt 216.0 lb

## 2019-07-30 DIAGNOSIS — M545 Low back pain: Secondary | ICD-10-CM

## 2019-07-30 DIAGNOSIS — G8929 Other chronic pain: Secondary | ICD-10-CM

## 2019-07-30 DIAGNOSIS — R030 Elevated blood-pressure reading, without diagnosis of hypertension: Secondary | ICD-10-CM | POA: Diagnosis not present

## 2019-07-30 DIAGNOSIS — G43909 Migraine, unspecified, not intractable, without status migrainosus: Secondary | ICD-10-CM

## 2019-07-30 NOTE — Patient Instructions (Signed)
Preventing Hypertension Hypertension, commonly called high blood pressure, is when the force of blood pumping through the arteries is too strong. Arteries are blood vessels that carry blood from the heart throughout the body. Over time, hypertension can damage the arteries and decrease blood flow to important parts of the body, including the brain, heart, and kidneys. Often, hypertension does not cause symptoms until blood pressure is very high. For this reason, it is important to have your blood pressure checked on a regular basis. Hypertension can often be prevented with diet and lifestyle changes. If you already have hypertension, you can control it with diet and lifestyle changes, as well as medicine. What nutrition changes can be made? Maintain a healthy diet. This includes:  Eating less salt (sodium). Ask your health care provider how much sodium is safe for you to have. The general recommendation is to consume less than 1 tsp (2,300 mg) of sodium a day. ? Do not add salt to your food. ? Choose low-sodium options when grocery shopping and eating out.  Limiting fats in your diet. You can do this by eating low-fat or fat-free dairy products and by eating less red meat.  Eating more fruits, vegetables, and whole grains. Make a goal to eat: ? 1-2 cups of fresh fruits and vegetables each day. ? 3-4 servings of whole grains each day.  Avoiding foods and beverages that have added sugars.  Eating fish that contain healthy fats (omega-3 fatty acids), such as mackerel or salmon. If you need help putting together a healthy eating plan, try the DASH diet. This diet is high in fruits, vegetables, and whole grains. It is low in sodium, red meat, and added sugars. DASH stands for Dietary Approaches to Stop Hypertension. What lifestyle changes can be made?   Lose weight if you are overweight. Losing just 3?5% of your body weight can help prevent or control hypertension. ? For example, if your present  weight is 200 lb (91 kg), a loss of 3-5% of your weight means losing 6-10 lb (2.7-4.5 kg). ? Ask your health care provider to help you with a diet and exercise plan to safely lose weight.  Get enough exercise. Do at least 150 minutes of moderate-intensity exercise each week. ? You could do this in short exercise sessions several times a day, or you could do longer exercise sessions a few times a week. For example, you could take a brisk 10-minute walk or bike ride, 3 times a day, for 5 days a week.  Find ways to reduce stress, such as exercising, meditating, listening to music, or taking a yoga class. If you need help reducing stress, ask your health care provider.  Do not smoke. This includes e-cigarettes. Chemicals in tobacco and nicotine products raise your blood pressure each time you smoke. If you need help quitting, ask your health care provider.  Avoid alcohol. If you drink alcohol, limit alcohol intake to no more than 1 drink a day for nonpregnant women and 2 drinks a day for men. One drink equals 12 oz of beer, 5 oz of wine, or 1 oz of hard liquor. Why are these changes important? Diet and lifestyle changes can help you prevent hypertension, and they may make you feel better overall and improve your quality of life. If you have hypertension, making these changes will help you control it and help prevent major complications, such as:  Hardening and narrowing of arteries that supply blood to: ? Your heart. This can cause a heart  attack. ? Your brain. This can cause a stroke. ? Your kidneys. This can cause kidney failure.  Stress on your heart muscle, which can cause heart failure. What can I do to lower my risk?  Work with your health care provider to make a hypertension prevention plan that works for you. Follow your plan and keep all follow-up visits as told by your health care provider.  Learn how to check your blood pressure at home. Make sure that you know your personal target  blood pressure, as told by your health care provider. How is this treated? In addition to diet and lifestyle changes, your health care provider may recommend medicines to help lower your blood pressure. You may need to try a few different medicines to find what works best for you. You also may need to take more than one medicine. Take over-the-counter and prescription medicines only as told by your health care provider. Where to find support Your health care provider can help you prevent hypertension and help you keep your blood pressure at a healthy level. Your local hospital or your community may also provide support services and prevention programs. The American Heart Association offers an online support network at: https://www.lee.net/ Where to find more information Learn more about hypertension from:  National Heart, Lung, and Blood Institute: https://www.peterson.org/  Centers for Disease Control and Prevention: AboutHD.co.nz  American Academy of Family Physicians: http://familydoctor.org/familydoctor/en/diseases-conditions/high-blood-pressure.printerview.all.html Learn more about the DASH diet from:  National Heart, Lung, and Blood Institute: WedMap.it Contact a health care provider if:  You think you are having a reaction to medicines you have taken.  You have recurrent headaches or feel dizzy.  You have swelling in your ankles.  You have trouble with your vision. Summary  Hypertension often does not cause any symptoms until blood pressure is very high. It is important to get your blood pressure checked regularly.  Diet and lifestyle changes are the most important steps in preventing hypertension.  By keeping your blood pressure in a healthy range, you can prevent complications like heart attack, heart failure, stroke, and kidney failure.  Work with your health care  provider to make a hypertension prevention plan that works for you. This information is not intended to replace advice given to you by your health care provider. Make sure you discuss any questions you have with your health care provider. Document Released: 11/27/2015 Document Revised: 03/06/2019 Document Reviewed: 07/23/2016 Elsevier Patient Education  2020 [employer].  Migraine Headache A migraine headache is an intense, throbbing pain on one side or both sides of the head. Migraine headaches may also cause other symptoms, such as nausea, vomiting, and sensitivity to light and noise. A migraine headache can last from 4 hours to 3 days. Talk with your doctor about what things may bring on (trigger) your migraine headaches. What are the causes? The exact cause of this condition is not known. However, a migraine may be caused when nerves in the brain become irritated and release chemicals that cause inflammation of blood vessels. This inflammation causes pain. This condition may be triggered or caused by:  Drinking alcohol.  Smoking.  Taking medicines, such as: ? Medicine used to treat chest pain (nitroglycerin). ? Birth control pills. ? Estrogen. ? Certain blood pressure medicines.  Eating or drinking products that contain nitrates, glutamate, aspartame, or tyramine. Aged cheeses, chocolate, or caffeine may also be triggers.  Doing physical activity. Other things that may trigger a migraine headache include:  Menstruation.  Pregnancy.  Hunger.  Stress.  Lack of sleep or too much sleep.  Weather changes.  Fatigue. What increases the risk? The following factors may make you more likely to experience migraine headaches:  Being a certain age. This condition is more common in people who are 4225-30 years old.  Being male.  Having a family history of migraine headaches.  Being Caucasian.  Having a mental health condition, such as depression or anxiety.  Being obese.  What are the signs or symptoms? The main symptom of this condition is pulsating or throbbing pain. This pain may:  Happen in any area of the head, such as on one side or both sides.  Interfere with daily activities.  Get worse with physical activity.  Get worse with exposure to bright lights or loud noises. Other symptoms may include:  Nausea.  Vomiting.  Dizziness.  General sensitivity to bright lights, loud noises, or smells. Before you get a migraine headache, you may get warning signs (an aura). An aura may include:  Seeing flashing lights or having blind spots.  Seeing bright spots, halos, or zigzag lines.  Having tunnel vision or blurred vision.  Having numbness or a tingling feeling.  Having trouble talking.  Having muscle weakness. Some people have symptoms after a migraine headache (postdromal phase), such as:  Feeling tired.  Difficulty concentrating. How is this diagnosed? A migraine headache can be diagnosed based on:  Your symptoms.  A physical exam.  Tests, such as: ? CT scan or an MRI of the head. These imaging tests can help rule out other causes of headaches. ? Taking fluid from the spine (lumbar puncture) and analyzing it (cerebrospinal fluid analysis, or CSF analysis). How is this treated? This condition may be treated with medicines that:  Relieve pain.  Relieve nausea.  Prevent migraine headaches. Treatment for this condition may also include:  Acupuncture.  Lifestyle changes like avoiding foods that trigger migraine headaches.  Biofeedback.  Cognitive behavioral therapy. Follow these instructions at home: Medicines  Take over-the-counter and prescription medicines only as told by your health care provider.  Ask your health care provider if the medicine prescribed to you: ? Requires you to avoid driving or using heavy machinery. ? Can cause constipation. You may need to take these actions to prevent or treat constipation:   Drink enough fluid to keep your urine pale yellow.  Take over-the-counter or prescription medicines.  Eat foods that are high in fiber, such as beans, whole grains, and fresh fruits and vegetables.  Limit foods that are high in fat and processed sugars, such as fried or sweet foods. Lifestyle  Do not drink alcohol.  Do not use any products that contain nicotine or tobacco, such as cigarettes, e-cigarettes, and chewing tobacco. If you need help quitting, ask your health care provider.  Get at least 8 hours of sleep every night.  Find ways to manage stress, such as meditation, deep breathing, or yoga. General instructions      Keep a journal to find out what may trigger your migraine headaches. For example, write down: ? What you eat and drink. ? How much sleep you get. ? Any change to your diet or medicines.  If you have a migraine headache: ? Avoid things that make your symptoms worse, such as bright lights. ? It may help to lie down in a dark, quiet room. ? Do not drive or use heavy machinery. ? Ask your health care provider what activities are safe for you while you are experiencing symptoms.  Keep all follow-up visits as told by your health care provider. This is important. Contact a health care provider if:  You develop symptoms that are different or more severe than your usual migraine headache symptoms.  You have more than 15 headache days in one month. Get help right away if:  Your migraine headache becomes severe.  Your migraine headache lasts longer than 72 hours.  You have a fever.  You have a stiff neck.  You have vision loss.  Your muscles feel weak or like you cannot control them.  You start to lose your balance often.  You have trouble walking.  You faint.  You have a seizure. Summary  A migraine headache is an intense, throbbing pain on one side or both sides of the head. Migraines may also cause other symptoms, such as nausea, vomiting, and  sensitivity to light and noise.  This condition may be treated with medicines and lifestyle changes. You may also need to avoid certain things that trigger a migraine headache.  Keep a journal to find out what may trigger your migraine headaches.  Contact your health care provider if you have more than 15 headache days in a month or you develop symptoms that are different or more severe than your usual migraine headache symptoms. This information is not intended to replace advice given to you by your health care provider. Make sure you discuss any questions you have with your health care provider. Document Released: 11/12/2005 Document Revised: 03/06/2019 Document Reviewed: 12/25/2018 Elsevier Patient Education  [place].  Chronic Back Pain When back pain lasts longer than 3 months, it is called chronic back pain.The cause of your back pain may not be known. Some common causes include:  Wear and tear (degenerative disease) of the bones, ligaments, or disks in your back.  Inflammation and stiffness in your back (arthritis). People who have chronic back pain often go through certain periods in which the pain is more intense (flare-ups). Many people can learn to manage the pain with home care. Follow these instructions at home: Pay attention to any changes in your symptoms. Take these actions to help with your pain: Activity   Avoid bending and other activities that make the problem worse.  Maintain a proper position when standing or sitting: ? When standing, keep your upper back and neck straight, with your shoulders pulled back. Avoid slouching. ? When sitting, keep your back straight and relax your shoulders. Do not round your shoulders or pull them backward.  Do not sit or stand in one place for long periods of time.  Take brief periods of rest throughout the day. This will reduce your pain. Resting in a lying or standing position is usually better than sitting to rest.   When you are resting for longer periods, mix in some mild activity or stretching between periods of rest. This will help to prevent stiffness and pain.  Get regular exercise. Ask your health care provider what activities are safe for you.  Do not lift anything that is heavier than 10 lb (4.5 kg). Always use proper lifting technique, which includes: ? Bending your knees. ? Keeping the load close to your body. ? Avoiding twisting.  Sleep on a firm mattress in a comfortable position. Try lying on your side with your knees slightly bent. If you lie on your back, put a pillow under your knees. Managing pain  If directed, apply ice to the painful area. Your health care provider may recommend applying  ice during the first 24-48 hours after a flare-up begins. ? Put ice in a plastic bag. ? Place a towel between your skin and the bag. ? Leave the ice on for 20 minutes, 2-3 times per day.  If directed, apply heat to the affected area as often as told by your health care provider. Use the heat source that your health care provider recommends, such as a moist heat pack or a heating pad. ? Place a towel between your skin and the heat source. ? Leave the heat on for 20-30 minutes. ? Remove the heat if your skin turns bright red. This is especially important if you are unable to feel pain, heat, or cold. You may have a greater risk of getting burned.  Try soaking in a warm tub.  Take over-the-counter and prescription medicines only as told by your health care provider.  Keep all follow-up visits as told by your health care provider. This is important. Contact a health care provider if:  You have pain that is not relieved with rest or medicine. Get help right away if:  You have weakness or numbness in one or both of your legs or feet.  You have trouble controlling your bladder or your bowels.  You have nausea or vomiting.  You have pain in your abdomen.  You have shortness of breath or you  faint. This information is not intended to replace advice given to you by your health care provider. Make sure you discuss any questions you have with your health care provider. Document Released: 12/20/2004 Document Revised: 03/05/2019 Document Reviewed: 05/22/2017 Elsevier Patient Education  2020 Else[employer] Inc.  Degenerative Disk Disease  Degenerative disk disease is a condition caused by changes that occur in the spinal disks as a person ages. Spinal disks are soft and compressible disks located between the bones of your spine (vertebrae). These disks act like shock absorbers. Degenerative disk disease can affect the whole spine. However, the neck and lower back are most often affected. Many changes can occur in the spinal disks with aging, such as:  The spinal disks may dry and shrink.  Small tears may occur in the tough, outer covering of the disk (annulus).  The disk space may become smaller due to loss of water.  Abnormal growths in the bone (spurs) may occur. This can put pressure on the nerve roots exiting the spinal canal, causing pain.  The spinal canal may become narrowed. What are the causes? This condition may be caused by:  Normal degeneration with age.  Injuries.  Certain activities and sports that cause damage. What increases the risk? The following factors may make you more likely to develop this condition:  Being overweight.  Having a family history of degenerative disk disease.  Smoking.  Sudden injury.  Doing work that requires heavy lifting. What are the signs or symptoms? Symptoms of this condition include:  Pain that varies in intensity. Some people have no pain, while others have severe pain. The location of the pain depends on the part of your backbone that is affected. You may have: ? Pain in your neck or arm if a disk in your neck area is affected. ? Pain in your back, buttocks, or legs if a disk in your lower back is affected.  Pain that  becomes worse while bending or reaching up, or with twisting movements.  Pain that may start gradually and then get worse as time passes. It may also start after a major or minor  injury.  Numbness or tingling in the arms or legs. How is this diagnosed? This condition may be diagnosed based on:  Your symptoms and medical history.  A physical exam.  Imaging tests, including: ? An X-ray of the spine. ? MRI. How is this treated? This condition may be treated with:  Medicines.  Rehabilitation exercises. These activities aim to strengthen muscles in your back and abdomen to better support your spine. If treatments do not help to relieve your symptoms or you have severe pain, you may need surgery. Follow these instructions at home: Medicines  Take over-the-counter and prescription medicines only as told by your health care provider.  Do not drive or use heavy machinery while taking prescription pain medicine.  If you are taking prescription pain medicine, take actions to prevent or treat constipation. Your health care provider may recommend that you: ? Drink enough fluid to keep your urine pale yellow. ? Eat foods that are high in fiber, such as fresh fruits and vegetables, whole grains, and beans. ? Limit foods that are high in fat and processed sugars, such as fried or sweet foods. ? Take an over-the-counter or prescription medicine for constipation. Activity  Rest as told by your health care provider.  Ask your health care provider what activities are safe for you. Return to your normal activities as directed.  Avoid sitting for a long time without moving. Get up to take short walks every 1-2 hours. This is important to improve blood flow and breathing. Ask for help if you feel weak or unsteady.  Perform relaxation exercises as told by your health care provider.  Maintain good posture.  Do not lift anything that is heavier than 10 lb (4.5 kg), or the limit that you are told,  until your health care provider says that it is safe.  Follow proper lifting and walking techniques as told by your health care provider. Managing pain, stiffness, and swelling   If directed, put ice on the painful area. Icing can help to relieve pain. ? Put ice in a plastic bag. ? Place a towel between your skin and the bag. ? Leave the ice on for 20 minutes, 2-3 times a day.  If directed, apply heat to the painful area as often as told by your health care provider. Heat can reduce the stiffness of your muscles. Use the heat source that your health care provider recommends, such as a moist heat pack or a heating pad. ? Place a towel between your skin and the heat source. ? Leave the heat on for 20-30 minutes. ? Remove the heat if your skin turns bright red. This is especially important if you are unable to feel pain, heat, or cold. You may have a greater risk of getting burned. General instructions  Change your sitting, standing, and sleeping habits as told by your health care provider.  Avoid sitting in the same position for long periods of time. Change positions frequently.  Lose weight or maintain a healthy weight as told by your health care provider.  Do not use any products that contain nicotine or tobacco, such as cigarettes and e-cigarettes. If you need help quitting, ask your health care provider.  Wear supportive footwear.  Keep all follow-up visits as told by your health care provider. This is important. This may include visits for physical therapy. Contact a health care provider if you:  Have pain that does not go away within 1-4 weeks.  Lose your appetite.  Lose weight without  trying. Get help right away if you:  Have severe pain.  Notice weakness in your arms, hands, or legs.  Begin to lose control of your bladder or bowel movements.  Have fevers or night sweats. Summary  Degenerative disk disease is a condition caused by changes that occur in the spinal  disks as a person ages.  Degenerative disk disease can affect the whole spine. However, the neck and lower back are most often affected.  Take over-the-counter and prescription medicines only as told by your health care provider. This information is not intended to replace advice given to you by your health care provider. Make sure you discuss any questions you have with your health care provider. Document Released: 09/09/2007 Document Revised: 11/07/2017 Document Reviewed: 11/07/2017 Elsevier Patient Education  2020 [employer].

## 2019-07-30 NOTE — Progress Notes (Signed)
Subjective:    Patient ID: Joseph Burns, male    DOB: [birth year], 28 y.o.   MRN: [id number]  No chief complaint on file.   HPI Patient was seen today for f/u on chronic conditions and TOC, previously seen by Inda Coke, PA.  HAs: -x 1 month -pain in L temporal area and behind eyes -endorses light sensitivity -will take Excedrin migraine  Elevated bp readings: -notes given rx for norvasc 5 mg in the past, but not currently taking -trying to work out -endorses eating fast food -denies changes in vision or chest pain. -not checking bp at home  H/o pelvic pain: -seen by Urology -told msk  Back pain: -low back pain -noted x a few wks -denies radiation into legs  Social hx: Pt is a graduate of [employer].  Pt denies tobacco and drug use.   Health maintenance: Pt inquires about possibility of having colon cancer screening done now.  Notes family hx of MGF (desc) having colon cancer or prostate cancer in his early 27s.  Also notes great uncles having colon cancer in 16s.  History reviewed. No pertinent past medical history.  No Known Allergies  ROS General: Denies fever, chills, night sweats, changes in weight, changes in appetite HEENT: Denies ear pain, changes in vision, rhinorrhea, sore throat  +HAs CV: Denies CP, palpitations, SOB, orthopnea Pulm: Denies SOB, cough, wheezing GI: Denies abdominal pain, nausea, vomiting, diarrhea, constipation GU: Denies dysuria, hematuria, frequency, vaginal discharge Msk: Denies muscle cramps, joint pains  +low back pain Neuro: Denies weakness, numbness, tingling Skin: Denies rashes, bruising Psych: Denies depression, anxiety, hallucinations    Objective:    Blood pressure 138/90, temperature (!) 96.4 F (35.8 C), temperature source Temporal, weight 216 lb (98 kg). BP recheck 118/69  Gen. Pleasant, well-nourished, in no distress, normal affect   HEENT: [place]/AT, face symmetric, no scleral icterus, PERRLA, EOMI, nares patent  without drainage, pharynx without erythema or exudate. Lungs: no accessory muscle use, CTAB, no wheezes or rales Cardiovascular: RRR, no m/r/g, no peripheral edema Abdomen: BS present, soft, NT/ND Musculoskeletal: No TTP of spine or paraspinal muscles.  No deformities, no cyanosis or clubbing, normal tone Neuro:  A&Ox3, CN II-XII intact, normal gait Skin:  Warm, no lesions/ rash   Wt Readings from Last 3 Encounters:  07/30/19 216 lb (98 kg)  10/07/18 200 lb 3.2 oz (90.8 kg)  01/08/18 202 lb 6.1 oz (91.8 kg)    Lab Results  Component Value Date   WBC 5.7 10/07/2018   HGB 15.5 10/07/2018   HCT 47.0 10/07/2018   PLT 214.0 10/07/2018   GLUCOSE 86 10/07/2018   CHOL 158 10/07/2018   TRIG 122.0 10/07/2018   HDL 51.00 10/07/2018   LDLCALC 83 10/07/2018   ALT 23 10/07/2018   AST 20 10/07/2018   NA 139 10/07/2018   K 4.3 10/07/2018   CL 103 10/07/2018   CREATININE 0.97 10/07/2018   BUN 14 10/07/2018   CO2 28 10/07/2018    Assessment/Plan:  Chronic bilateral low back pain without sciatica -supportive care: NSAIDs, heat, massage, OTC topicals, stretching -consider PT -given handout  Elevated blood pressure reading -lifestyle modifications encouraged -consider checking bp at home -given handouts -f/u in 1 month  Migraine without status migrainosus, not intractable, unspecified migraine type -discussed HA prevention -continue excedrin migraine prn -if HA frequency increases consider rx medication  F/u in 1 month  [name], MD

## 2019-09-02 ENCOUNTER — Encounter: Payer: BC Managed Care – PPO | Admitting: Family Medicine

## 2019-09-02 ENCOUNTER — Other Ambulatory Visit: Payer: Self-pay

## 2019-09-02 ENCOUNTER — Ambulatory Visit (INDEPENDENT_AMBULATORY_CARE_PROVIDER_SITE_OTHER): Payer: BC Managed Care – PPO | Admitting: Family Medicine

## 2019-09-02 ENCOUNTER — Encounter: Payer: Self-pay | Admitting: Family Medicine

## 2019-09-02 ENCOUNTER — Ambulatory Visit (INDEPENDENT_AMBULATORY_CARE_PROVIDER_SITE_OTHER): Payer: BC Managed Care – PPO

## 2019-09-02 VITALS — BP 126/88 | HR 74 | Temp 98.5°F | Wt 217.0 lb

## 2019-09-02 DIAGNOSIS — M545 Low back pain, unspecified: Secondary | ICD-10-CM

## 2019-09-02 DIAGNOSIS — Z131 Encounter for screening for diabetes mellitus: Secondary | ICD-10-CM | POA: Diagnosis not present

## 2019-09-02 DIAGNOSIS — Z13220 Encounter for screening for lipoid disorders: Secondary | ICD-10-CM | POA: Diagnosis not present

## 2019-09-02 DIAGNOSIS — Z0000 Encounter for general adult medical examination without abnormal findings: Secondary | ICD-10-CM

## 2019-09-02 DIAGNOSIS — Z113 Encounter for screening for infections with a predominantly sexual mode of transmission: Secondary | ICD-10-CM | POA: Diagnosis not present

## 2019-09-02 LAB — COMPREHENSIVE METABOLIC PANEL
ALT: 43 U/L (ref 0–53)
AST: 27 U/L (ref 0–37)
Albumin: 4.9 g/dL (ref 3.5–5.2)
Alkaline Phosphatase: 63 U/L (ref 39–117)
BUN: 11 mg/dL (ref 6–23)
CO2: 27 mEq/L (ref 19–32)
Calcium: 9.7 mg/dL (ref 8.4–10.5)
Chloride: 103 mEq/L (ref 96–112)
Creatinine, Ser: 0.93 mg/dL (ref 0.40–1.50)
GFR: 115.29 mL/min (ref >60.00)
Glucose, Bld: 86 mg/dL (ref 70–99)
Potassium: 4.2 mEq/L (ref 3.5–5.1)
Sodium: 138 mEq/L (ref 135–145)
Total Bilirubin: 0.5 mg/dL (ref 0.2–1.2)
Total Protein: 8 g/dL (ref 6.0–8.3)

## 2019-09-02 LAB — CBC WITH DIFFERENTIAL/PLATELET
Basophils Absolute: 0 10*3/uL (ref 0.0–0.1)
Basophils Relative: 0.8 % (ref 0.0–3.0)
Eosinophils Absolute: 0.2 10*3/uL (ref 0.0–0.7)
Eosinophils Relative: 2.8 % (ref 0.0–5.0)
HCT: 46.2 % (ref 39.0–52.0)
Hemoglobin: 15.3 g/dL (ref 13.0–17.0)
Lymphocytes Relative: 26.9 % (ref 12.0–46.0)
Lymphs Abs: 1.5 10*3/uL (ref 0.7–4.0)
MCHC: 33 g/dL (ref 30.0–36.0)
MCV: 82 fl (ref 78.0–100.0)
Monocytes Absolute: 0.7 10*3/uL (ref 0.1–1.0)
Monocytes Relative: 11.4 % (ref 3.0–12.0)
Neutro Abs: 3.4 10*3/uL (ref 1.4–7.7)
Neutrophils Relative %: 58.1 % (ref 43.0–77.0)
Platelets: 207 10*3/uL (ref 150.0–400.0)
RBC: 5.64 Mil/uL (ref 4.22–5.81)
RDW: 14.2 % (ref 11.5–15.5)
WBC: 5.8 10*3/uL (ref 4.0–10.5)

## 2019-09-02 LAB — LIPID PANEL
Cholesterol: 173 mg/dL (ref 0–200)
HDL: 47.6 mg/dL (ref >39.00)
LDL Cholesterol: 111 mg/dL — ABNORMAL HIGH (ref 0–99)
NonHDL: 125.76
Total CHOL/HDL Ratio: 4
Triglycerides: 75 mg/dL (ref 0.0–149.0)
VLDL: 15 mg/dL (ref 0.0–40.0)

## 2019-09-02 LAB — HEMOGLOBIN A1C: Hgb A1c MFr Bld: 6.5 % (ref 4.6–6.5)

## 2019-09-02 NOTE — Progress Notes (Signed)
Subjective:     Joseph Burns is a 28 y.o. male and is here for a comprehensive physical exam. The patient reports problems - h/o low back pain.  Pt inquires about an EKG.  Pt states in the past he was told he had "pre-arthritis" in his back.  Pt is asymptomatic but wants to see if thing have become worse.  At establish care visit, pt inquired about getting a colonoscopy.  Age recommendations for colon cancer reviewed with pt.  No immediate family members with early h/o colon cancer noted.   Pt inquires about getting an EKG.  States in the past he had CP that was believed to be MSK in nature.  Not currently having CP or any other symptoms.  Patient states in the past was advised to start Norvasc 5 mg for elevated blood pressure, however he has not.  Patient does not want to have to take medication.  At the end of visit pt ask nurse why a genital exam was not done. Pt is asymptomatic for any GU symptoms.  Pt re-roomed for exam.    Social History   Socioeconomic History  . Marital status: Single    Spouse name: Not on file  . Number of children: Not on file  . Years of education: Not on file  . Highest education level: Not on file  Occupational History  . Not on file  Social Needs  . Financial resource strain: Not on file  . Food insecurity    Worry: Not on file    Inability: Not on file  . Transportation needs    Medical: Not on file    Non-medical: Not on file  Tobacco Use  . Smoking status: Never Smoker  . Smokeless tobacco: Never Used  Substance and Sexual Activity  . Alcohol use: Yes    Comment: 6 cans of beer weekly on average  . Drug use: Yes    Frequency: 2.0 times per week    Types: Marijuana  . Sexual activity: Yes    Partners: Female  Lifestyle  . Physical activity    Days per week: Not on file    Minutes per session: Not on file  . Stress: Not on file  Relationships  . Social Musician on phone: Not on file    Gets together: Not on file    Attends  religious service: Not on file    Active member of club or organization: Not on file    Attends meetings of clubs or organizations: Not on file    Relationship status: Not on file  . Intimate partner violence    Fear of current or ex partner: Not on file    Emotionally abused: Not on file    Physically abused: Not on file    Forced sexual activity: Not on file  Other Topics Concern  . Not on file  Social History Narrative   [place] [employer]    Lives in [place]   Single   No children   Fun: movies, games, hanging out with friends   Health Maintenance  Topic Date Due  . Joseph Burns  03/01/2027  . HIV Screening  Completed  . INFLUENZA VACCINE  Discontinued    The following portions of the patient's history were reviewed and updated as appropriate: allergies, current medications, past family history, past medical history, past social history, past surgical history and problem list.  Review of Systems Pertinent items noted in HPI and remainder of comprehensive  ROS otherwise negative.   Objective:    BP 126/88 (BP Location: Left Arm, Patient Position: Sitting, Cuff Size: Large)   Pulse 74   Temp 98.5 F (36.9 C) (Oral)   Wt 217 lb (98.4 kg)   SpO2 99%   BMI 35.02 kg/m  General appearance: alert, cooperative and no distress Head: Normocephalic, without obvious abnormality, atraumatic Eyes: conjunctivae/corneas clear. PERRL, EOM's intact. Fundi benign. Ears: normal TM's and external ear canals both ears Nose: Nares normal. Septum midline. Mucosa normal. No drainage or sinus tenderness. Throat: lips, mucosa, and tongue normal; teeth and gums normal Neck: no adenopathy, no carotid bruit, no JVD, supple, symmetrical, trachea midline and thyroid not enlarged, symmetric, no tenderness/mass/nodules Lungs: clear to auscultation bilaterally Heart: regular rate and rhythm, S1, S2 normal, no murmur, click, rub or gallop Abdomen: soft, non-tender; bowel sounds normal; no masses,  no  organomegaly  GU:  Normal external male genitalia.  Normal circumcised penis without lesion, erythema, discharge, or deformity.  No hernia noted.  Normal testes, descended bilaterally.  Normal scrotum without lesion.  No masses noted.  No inguinal lymphadenopathy.  Chaperone present: Joseph Burns, CMA Extremities: extremities normal, atraumatic, no cyanosis or edema Pulses: 2+ and symmetric Skin: Skin color, texture, turgor normal. No rashes or lesions Lymph nodes: Cervical, supraclavicular, and axillary nodes normal. Neurologic: Alert and oriented X 3, normal strength and tone. Normal symmetric reflexes. Normal coordination and gait    Assessment:    Healthy male exam. Pt anxious about overall health.     Plan:     Anticipatory guidance given including wearing seatbelts, smoke detectors in the home, increasing physical activity, increasing p.o. intake of water and vegetables. -We will obtain labs and STI screening -Discussed cancer screening recommendations -Patient advised on self testicular exam -Discussed lifestyle modifications in an effort to lose weight to help with blood pressure control given elevated readings in the past. -Offered influenza vaccine.  However patient declines. -Next CPE in 1 year -Given handout See After Visit Summary for Counseling Recommendations   Lumbar back pain  - Plan: Comprehensive metabolic panel, DG Lumbar Spine Complete  Screening for diabetes mellitus  - Plan: Hemoglobin A1c  Screening for cholesterol level  - Plan: Lipid panel  Routine screening for STI (sexually transmitted infection)  - Plan: RPR, HIV antibody (with reflex), C. trachomatis/N. gonorrhoeae RNA  Follow-up PRN in the next few months for BP check.  Joseph Mitts, MD

## 2019-09-02 NOTE — Patient Instructions (Signed)
Preventive Care 48-30 Years Old, Male Preventive care refers to lifestyle choices and visits with your health care provider that can promote health and wellness. This includes:  A yearly physical exam. This is also called an annual well check.  Regular dental and eye exams.  Immunizations.  Screening for certain conditions.  Healthy lifestyle choices, such as eating a healthy diet, getting regular exercise, not using drugs or products that contain nicotine and tobacco, and limiting alcohol use. What can I expect for my preventive care visit? Physical exam Your health care provider will check:  Height and weight. These may be used to calculate body mass index (BMI), which is a measurement that tells if you are at a healthy weight.  Heart rate and blood pressure.  Your skin for abnormal spots. Counseling Your health care provider may ask you questions about:  Alcohol, tobacco, and drug use.  Emotional well-being.  Home and relationship well-being.  Sexual activity.  Eating habits.  Work and work Statistician. What immunizations do I need?  Influenza (flu) vaccine  This is recommended every year. Tetanus, diphtheria, and pertussis (Tdap) vaccine  You may need a Td booster every 10 years. Varicella (chickenpox) vaccine  You may need this vaccine if you have not already been vaccinated. Human papillomavirus (HPV) vaccine  If recommended by your health care provider, you may need three doses over 6 months. Measles, mumps, and rubella (MMR) vaccine  You may need at least one dose of MMR. You may also need a second dose. Meningococcal conjugate (MenACWY) vaccine  One dose is recommended if you are 47-13 years old and a Market researcher living in a residence hall, or if you have one of several medical conditions. You may also need additional booster doses. Pneumococcal conjugate (PCV13) vaccine  You may need this if you have certain conditions and were not  previously vaccinated. Pneumococcal polysaccharide (PPSV23) vaccine  You may need one or two doses if you smoke cigarettes or if you have certain conditions. Hepatitis A vaccine  You may need this if you have certain conditions or if you travel or work in places where you may be exposed to hepatitis A. Hepatitis B vaccine  You may need this if you have certain conditions or if you travel or work in places where you may be exposed to hepatitis B. Haemophilus influenzae type b (Hib) vaccine  You may need this if you have certain risk factors. You may receive vaccines as individual doses or as more than one vaccine together in one shot (combination vaccines). Talk with your health care provider about the risks and benefits of combination vaccines. What tests do I need? Blood tests  Lipid and cholesterol levels. These may be checked every 5 years starting at age 5.  Hepatitis C test.  Hepatitis B test. Screening   Diabetes screening. This is done by checking your blood sugar (glucose) after you have not eaten for a while (fasting).  Sexually transmitted disease (STD) testing. Talk with your health care provider about your test results, treatment options, and if necessary, the need for more tests. Follow these instructions at home: Eating and drinking   Eat a diet that includes fresh fruits and vegetables, whole grains, lean protein, and low-fat dairy products.  Take vitamin and mineral supplements as recommended by your health care provider.  Do not drink alcohol if your health care provider tells you not to drink.  If you drink alcohol: ? Limit how much you have to 0-2  drinks a day. ? Be aware of how much alcohol is in your drink. In the [place] one drink equals one 12 oz bottle of beer (355 mL), one 5 oz glass of wine (148 mL), or one 1 oz glass of hard liquor (44 mL). Lifestyle  Take daily care of your teeth and gums.  Stay active. Exercise for at least 30 minutes on 5 or  more days each week.  Do not use any products that contain nicotine or tobacco, such as cigarettes, e-cigarettes, and chewing tobacco. If you need help quitting, ask your health care provider.  If you are sexually active, practice safe sex. Use a condom or other form of protection to prevent STIs (sexually transmitted infections). What's next?  Go to your health care provider once a year for a well check visit.  Ask your health care provider how often you should have your eyes and teeth checked.  Stay up to date on all vaccines. This information is not intended to replace advice given to you by your health care provider. Make sure you discuss any questions you have with your health care provider. Document Released: 01/08/2002 Document Revised: 11/06/2018 Document Reviewed: 11/06/2018 Elsevier Patient Education  [place].  Chronic Back Pain When back pain lasts longer than 3 months, it is called chronic back pain. Pain may get worse at certain times (flare-ups). There are things you can do at home to manage your pain. Follow these instructions at home: Activity      Avoid bending and other activities that make pain worse.  When standing: ? Keep your upper back and neck straight. ? Keep your shoulders pulled back. ? Avoid slouching.  When sitting: ? Keep your back straight. ? Relax your shoulders. Do not round your shoulders or pull them backward.  Do not sit or stand in one place for long periods of time.  Take short rest breaks during the day. Lying down or standing is usually better than sitting. Resting can help relieve pain.  When sitting or lying down for a long time, do some mild activity or stretching. This will help to prevent stiffness and pain.  Get regular exercise. Ask your doctor what activities are safe for you.  Do not lift anything that is heavier than 10 lb (4.5 kg). To prevent injury when you lift things: ? Bend your knees. ? Keep the weight  close to your body. ? Avoid twisting. Managing pain  If told, put ice on the painful area. Your doctor may tell you to use ice for 24-48 hours after a flare-up starts. ? Put ice in a plastic bag. ? Place a towel between your skin and the bag. ? Leave the ice on for 20 minutes, 2-3 times a day.  If told, put heat on the painful area as often as told by your doctor. Use the heat source that your doctor recommends, such as a moist heat pack or a heating pad. ? Place a towel between your skin and the heat source. ? Leave the heat on for 20-30 minutes. ? Remove the heat if your skin turns bright red. This is especially important if you are unable to feel pain, heat, or cold. You may have a greater risk of getting burned.  Soak in a warm bath. This can help relieve pain.  Take over-the-counter and prescription medicines only as told by your doctor. General instructions  Sleep on a firm mattress. Try lying on your side with your knees slightly bent.  If you lie on your back, put a pillow under your knees.  Keep all follow-up visits as told by your doctor. This is important. Contact a doctor if:  You have pain that does not get better with rest or medicine. Get help right away if:  One or both of your arms or legs feel weak.  One or both of your arms or legs lose feeling (numbness).  You have trouble controlling when you poop (bowel movement) or pee (urinate).  You feel sick to your stomach (nauseous).  You throw up (vomit).  You have belly (abdominal) pain.  You have shortness of breath.  You pass out (faint). Summary  When back pain lasts longer than 3 months, it is called chronic back pain.  Pain may get worse at certain times (flare-ups).  Use ice and heat as told by your doctor. Your doctor may tell you to use ice after flare-ups. This information is not intended to replace advice given to you by your health care provider. Make sure you discuss any questions you have with  your health care provider. Document Released: 04/30/2008 Document Revised: 03/05/2019 Document Reviewed: 06/27/2017 Elsevier Patient Education  2020 [place] Injury Prevention Back injuries can be very painful. They can also be difficult to heal. After having one back injury, you are more likely to have another one again. It is important to learn how to avoid injuring or re-injuring your back. The following tips can help you to prevent a back injury. What actions can I take to prevent back injuries? Changes in your diet Talk with your doctor about what to eat. Some foods can make the bones strong.  Talk with your doctor about how much calcium and vitamin D you need each day. These nutrients help to prevent weakening of the bones (osteoporosis).  Eat foods that have calcium. These include: ? Dairy products. ? Green leafy vegetables. ? Food and drinks that have had calcium added to them (fortified).  Eat foods that have vitamin D. These include: ? Milk. ? Food and drinks that have had vitamin D added to them.  Take other supplements and vitamins only as told by your doctor. Physical fitness Physical fitness makes your bones and muscles strong. It also improves your balance and strength.  Exercise for 30 minutes per day on most days of the week, or as told by your doctor. Make sure to: ? Do aerobic exercises, such as walking, jogging, biking, or swimming. ? Do exercises that increase balance and strength, such as tai chi and yoga. ? Do stretching exercises. This helps with flexibility. ? Develop strong belly (abdominal) muscles. Your belly muscles help to support your back.  Stay at a healthy weight. This lowers your risk of a back injury. Good posture        Prevent back injuries by developing and maintaining a good posture. To do this:  Sit up straight and stand up straight. Avoid leaning forward when you sit or hunching over when you stand.  Choose chairs that  have good low-back (lumbar) support.  If you work at a desk: ? Sit close to it so you do not need to lean over. ? Keep your chin tucked in. ? Keep your neck drawn back. ? Keep your elbows bent so that your arms make a corner (right angle).  When you drive: ? Sit high and close to the steering wheel. Add a low-back support to your car seat, if needed. ? Take breaks every  hour if you are driving for long periods of time.  Avoid sitting or standing in one position for very long. Take breaks to get up, stretch, and walk around at least once every hour.  Sleep on your side with your knees slightly bent, or sleep on your back with a pillow under your knees.  Lifting, twisting, and reaching   Heavy lifting ? Avoid heavy lifting, especially lifting over and over again. If you must do heavy lifting: ? Stretch before lifting. ? Work slowly. ? Rest between lifts. ? Use a tool such as a cart or a dolly to move objects if one is available. ? Make several small trips instead of carrying one heavy load. ? Ask for help when you need it, especially when moving big objects. ? Follow these steps when lifting: ? Stand with your feet shoulder-width apart. ? Get as close to the object as you can. Do not pick up a heavy object that is far from your body. ? Use handles or lifting straps if they are available. ? Bend at your knees. Squat down, but keep your heels off the floor. ? Keep your shoulders back. Keep your chin tucked in. Keep your back straight. ? Lift the object slowly while you tighten the muscles in your legs, belly, and bottom. Keep the object as close to the center of your body as possible. ? Follow these steps when putting down a heavy load: ? Stand with your feet shoulder-width apart. ? Lower the object slowly while you tighten the muscles in your legs, belly, and bottom. Keep the object as close to the center of your body as possible. ? Keep your shoulders back. Keep your chin tucked  in. Keep your back straight. ? Bend at your knees. Squat down, but keep your heels off the floor. ? Use handles or lifting straps if they are available.  Twisting and reaching ? Avoid lifting heavy objects above your waist. ? Do not twist at your waist while you are lifting or carrying a load. If you need to turn, move your feet. ? Do not bend over without bending at your knees. ? Avoid reaching over your head, across a table, or for an object on a high surface. Other things to do   Avoid wet floors and icy ground. Keep sidewalks clear of ice to prevent falls.  Do not sleep on a mattress that is too soft or too hard.  Store heavier objects on shelves at waist level.  Store lighter objects on lower or higher shelves.  Find ways to lower your stress, such as: ? Exercise. ? Massage. ? Relaxation techniques.  Talk with your doctor if you feel anxious or depressed. These conditions can make back pain worse.  Wear flat heel shoes with cushioned soles.  Use both shoulder straps when carrying a backpack.  Do not use any products that contain nicotine or tobacco, such as cigarettes and e-cigarettes. If you need help quitting, ask your doctor. Summary  Back injuries can be very painful and difficult to heal.  You can keep your back healthy by making certain changes. These include eating foods that make bones strong, working on being physically fit, developing a good posture, and lifting heavy objects in a safe way. This information is not intended to replace advice given to you by your health care provider. Make sure you discuss any questions you have with your health care provider. Document Released: 04/30/2008 Document Revised: 01/03/2018 Document Reviewed: 01/03/2018 Elsevier Patient Education  [place].

## 2019-09-03 LAB — C. TRACHOMATIS/N. GONORRHOEAE RNA
C. trachomatis RNA, TMA: NOT DETECTED
N. gonorrhoeae RNA, TMA: NOT DETECTED

## 2019-09-03 LAB — RPR: RPR Ser Ql: NONREACTIVE

## 2019-09-03 LAB — HIV ANTIBODY (ROUTINE TESTING W REFLEX): HIV 1&2 Ab, 4th Generation: NONREACTIVE

## 2019-09-04 ENCOUNTER — Encounter: Payer: Self-pay | Admitting: Family Medicine

## 2019-09-04 DIAGNOSIS — E119 Type 2 diabetes mellitus without complications: Secondary | ICD-10-CM | POA: Insufficient documentation

## 2020-05-04 ENCOUNTER — Other Ambulatory Visit: Payer: Self-pay

## 2020-05-05 ENCOUNTER — Encounter: Payer: Self-pay | Admitting: Family Medicine

## 2020-05-05 ENCOUNTER — Ambulatory Visit (INDEPENDENT_AMBULATORY_CARE_PROVIDER_SITE_OTHER): Payer: BC Managed Care – PPO | Admitting: Family Medicine

## 2020-05-05 VITALS — BP 123/82 | HR 62 | Temp 97.7°F | Wt 209.0 lb

## 2020-05-05 DIAGNOSIS — R030 Elevated blood-pressure reading, without diagnosis of hypertension: Secondary | ICD-10-CM

## 2020-05-05 DIAGNOSIS — L989 Disorder of the skin and subcutaneous tissue, unspecified: Secondary | ICD-10-CM | POA: Diagnosis not present

## 2020-05-05 NOTE — Progress Notes (Signed)
Subjective:    Patient ID: Joseph Burns, male    DOB: 04-Jun-1991, 29 y.o.   MRN: [id number]  No chief complaint on file.   HPI Patient was seen today for f/u.  Pt notes elevated bp a few months ago at the dentist.  Pt does not recall the readings, but thinks it was 130/100?Marland Kitchen  States they used several different wrist cuffs.  In the past pt was on norvasc 5 mg for bp, but is not taking. Pt notes not working out and eating poorly as he just had a new baby.  Pt taking beet root powder BID.  Pt also notes bumps in posterior scalp.  Thought was 2/2 'dirty clippers" at the barber, but then keeping reoccurring.  Areas tender or pruritic at times.  Pt wants to see a dermatologist.  History reviewed. No pertinent past medical history.  No Known Allergies  ROS General: Denies fever, chills, night sweats, changes in weight, changes in appetite HEENT: Denies headaches, ear pain, changes in vision, rhinorrhea, sore throat CV: Denies CP, palpitations, SOB, orthopnea Pulm: Denies SOB, cough, wheezing GI: Denies abdominal pain, nausea, vomiting, diarrhea, constipation GU: Denies dysuria, hematuria, frequency, vaginal discharge Msk: Denies muscle cramps, joint pains Neuro: Denies weakness, numbness, tingling Skin: Denies rashes, bruising  +bumps on scalp Psych: Denies depression, anxiety, hallucinations     Objective:    Blood pressure 123/82, pulse 62, temperature 97.7 F (36.5 C), temperature source Temporal, weight 209 lb (94.8 kg), SpO2 98 %.  BP per CMA 110/84.  128/82 per provider  Gen. Pleasant, well-nourished, in no distress, normal affect  HEENT: [place]/AT, face symmetric, no scleral icterus, PERRLA, EOMI, nares patent without drainage Lungs: no accessory muscle use Cardiovascular: RRR, no m/r/g, no peripheral edema Neuro:  A&Ox3, CN II-XII intact, normal gait Skin:  Warm, dry, intact, no rash.  Several erythematous slightly raised papules on posterior scalp, non fluctuant.  A few dried  lesions noted.      Wt Readings from Last 3 Encounters:  05/05/20 209 lb (94.8 kg)  09/02/19 217 lb (98.4 kg)  07/30/19 216 lb (98 kg)    Lab Results  Component Value Date   WBC 5.8 09/02/2019   HGB 15.3 09/02/2019   HCT 46.2 09/02/2019   PLT 207.0 09/02/2019   GLUCOSE 86 09/02/2019   CHOL 173 09/02/2019   TRIG 75.0 09/02/2019   HDL 47.60 09/02/2019   LDLCALC 111 (H) 09/02/2019   ALT 43 09/02/2019   AST 27 09/02/2019   NA 138 09/02/2019   K 4.2 09/02/2019   CL 103 09/02/2019   CREATININE 0.93 09/02/2019   BUN 11 09/02/2019   CO2 27 09/02/2019   HGBA1C 6.5 09/02/2019    Assessment/Plan:  Elevated blood pressure reading without diagnosis of hypertension -BP in office 123/82 -Discussed lifestyle modifications including increasing physical activity, decreasing sodium intake, and increasing p.o. intake of water -Pt to obtain BP cuff for home monitoring -For continued elevation in BP will restart Norvasc 5 mg daily -Given handouts -Follow-up in 1 month, sooner if needed  Scalp lesion  -Supportive care keeping areas clean and dry. -Avoid scratching. - Plan: Ambulatory referral to Dermatology  F/u in 1 month  Abbe Amsterdam, MD

## 2020-05-05 NOTE — Patient Instructions (Addendum)
Omron makes a good blood pressure cuff.  You can find a blood pressure cuff at your local [employer], [employer], drugstore, or online.  Try to check your blood pressure at least once a day.  Cut down the amount of salt and processed foods you are eating.  We will have you follow-up in 1 month.  Preventing Hypertension Hypertension, commonly called high blood pressure, is when the force of blood pumping through the arteries is too strong. Arteries are blood vessels that carry blood from the heart throughout the body. Over time, hypertension can damage the arteries and decrease blood flow to important parts of the body, including the brain, heart, and kidneys. Often, hypertension does not cause symptoms until blood pressure is very high. For this reason, it is important to have your blood pressure checked on a regular basis. Hypertension can often be prevented with diet and lifestyle changes. If you already have hypertension, you can control it with diet and lifestyle changes, as well as medicine. What nutrition changes can be made? Maintain a healthy diet. This includes:  Eating less salt (sodium). Ask your health care provider how much sodium is safe for you to have. The general recommendation is to consume less than 1 tsp (2,300 mg) of sodium a day. ? Do not add salt to your food. ? Choose low-sodium options when grocery shopping and eating out.  Limiting fats in your diet. You can do this by eating low-fat or fat-free dairy products and by eating less red meat.  Eating more fruits, vegetables, and whole grains. Make a goal to eat: ? 1-2 cups of fresh fruits and vegetables each day. ? 3-4 servings of whole grains each day.  Avoiding foods and beverages that have added sugars.  Eating fish that contain healthy fats (omega-3 fatty acids), such as mackerel or salmon. If you need help putting together a healthy eating plan, try the DASH diet. This diet is high in fruits, vegetables, and whole grains. It  is low in sodium, red meat, and added sugars. DASH stands for Dietary Approaches to Stop Hypertension. What lifestyle changes can be made?   Lose weight if you are overweight. Losing just 3?5% of your body weight can help prevent or control hypertension. ? For example, if your present weight is 200 lb (91 kg), a loss of 3-5% of your weight means losing 6-10 lb (2.7-4.5 kg). ? Ask your health care provider to help you with a diet and exercise plan to safely lose weight.  Get enough exercise. Do at least 150 minutes of moderate-intensity exercise each week. ? You could do this in short exercise sessions several times a day, or you could do longer exercise sessions a few times a week. For example, you could take a brisk 10-minute walk or bike ride, 3 times a day, for 5 days a week.  Find ways to reduce stress, such as exercising, meditating, listening to music, or taking a yoga class. If you need help reducing stress, ask your health care provider.  Do not smoke. This includes e-cigarettes. Chemicals in tobacco and nicotine products raise your blood pressure each time you smoke. If you need help quitting, ask your health care provider.  Avoid alcohol. If you drink alcohol, limit alcohol intake to no more than 1 drink a day for nonpregnant women and 2 drinks a day for men. One drink equals 12 oz of beer, 5 oz of wine, or 1 oz of hard liquor. Why are these changes important? Diet and  lifestyle changes can help you prevent hypertension, and they may make you feel better overall and improve your quality of life. If you have hypertension, making these changes will help you control it and help prevent major complications, such as:  Hardening and narrowing of arteries that supply blood to: ? Your heart. This can cause a heart attack. ? Your brain. This can cause a stroke. ? Your kidneys. This can cause kidney failure.  Stress on your heart muscle, which can cause heart failure. What can I do to lower  my risk?  Work with your health care provider to make a hypertension prevention plan that works for you. Follow your plan and keep all follow-up visits as told by your health care provider.  Learn how to check your blood pressure at home. Make sure that you know your personal target blood pressure, as told by your health care provider. How is this treated? In addition to diet and lifestyle changes, your health care provider may recommend medicines to help lower your blood pressure. You may need to try a few different medicines to find what works best for you. You also may need to take more than one medicine. Take over-the-counter and prescription medicines only as told by your health care provider. Where to find support Your health care provider can help you prevent hypertension and help you keep your blood pressure at a healthy level. Your local hospital or your community may also provide support services and prevention programs. The American Heart Association offers an online support network at: https://www.lee.net/ Where to find more information Learn more about hypertension from:  National Heart, Lung, and Blood Institute: https://www.peterson.org/  Centers for Disease Control and Prevention: AboutHD.co.nz  American Academy of Family Physicians: http://familydoctor.org/familydoctor/en/diseases-conditions/high-blood-pressure.printerview.all.html Learn more about the DASH diet from:  National Heart, Lung, and Blood Institute: WedMap.it Contact a health care provider if:  You think you are having a reaction to medicines you have taken.  You have recurrent headaches or feel dizzy.  You have swelling in your ankles.  You have trouble with your vision. Summary  Hypertension often does not cause any symptoms until blood pressure is very high. It is important to get your blood  pressure checked regularly.  Diet and lifestyle changes are the most important steps in preventing hypertension.  By keeping your blood pressure in a healthy range, you can prevent complications like heart attack, heart failure, stroke, and kidney failure.  Work with your health care provider to make a hypertension prevention plan that works for you. This information is not intended to replace advice given to you by your health care provider. Make sure you discuss any questions you have with your health care provider. Document Revised: 03/06/2019 Document Reviewed: 07/23/2016 Elsevier Patient Education  2020 [employer].  How to Take Your Blood Pressure You can take your blood pressure at home with a machine. You may need to check your blood pressure at home:  To check if you have high blood pressure (hypertension).  To check your blood pressure over time.  To make sure your blood pressure medicine is working. Supplies needed: You will need a blood pressure machine, or monitor. You can buy one at a drugstore or online. When choosing one:  Choose one with an arm cuff.  Choose one that wraps around your upper arm. Only one finger should fit between your arm and the cuff.  Do not choose one that measures your blood pressure from your wrist or finger. Your doctor can  suggest a monitor. How to prepare Avoid these things for 30 minutes before checking your blood pressure:  Drinking caffeine.  Drinking alcohol.  Eating.  Smoking.  Exercising. Five minutes before checking your blood pressure:  Pee.  Sit in a dining chair. Avoid sitting in a soft couch or armchair.  Be quiet. Do not talk. How to take your blood pressure Follow the instructions that came with your machine. If you have a digital blood pressure monitor, these may be the instructions: 1. Sit up straight. 2. Place your feet on the floor. Do not cross your ankles or legs. 3. Rest your left arm at the level of  your heart. You may rest it on a table, desk, or chair. 4. Pull up your shirt sleeve. 5. Wrap the blood pressure cuff around the upper part of your left arm. The cuff should be 1 inch (2.5 cm) above your elbow. It is best to wrap the cuff around bare skin. 6. Fit the cuff snugly around your arm. You should be able to place only one finger between the cuff and your arm. 7. Put the cord inside the groove of your elbow. 8. Press the power button. 9. Sit quietly while the cuff fills with air and loses air. 10. Write down the numbers on the screen. 11. Wait 2-3 minutes and then repeat steps 1-10. What do the numbers mean? Two numbers make up your blood pressure. The first number is called systolic pressure. The second is called diastolic pressure. An example of a blood pressure reading is "120 over 80" (or 120/80). If you are an adult and do not have a medical condition, use this guide to find out if your blood pressure is normal: Normal  First number: below 120.  Second number: below 80. Elevated  First number: 120-129.  Second number: below 80. Hypertension stage 1  First number: 130-139.  Second number: 80-89. Hypertension stage 2  First number: 140 or above.  Second number: 23 or above. Your blood pressure is above normal even if only the top or bottom number is above normal. Follow these instructions at home:  Check your blood pressure as often as your doctor tells you to.  Take your monitor to your next doctor's appointment. Your doctor will: ? Make sure you are using it correctly. ? Make sure it is working right.  Make sure you understand what your blood pressure numbers should be.  Tell your doctor if your medicines are causing side effects. Contact a doctor if:  Your blood pressure keeps being high. Get help right away if:  Your first blood pressure number is higher than 180.  Your second blood pressure number is higher than 120. This information is not  intended to replace advice given to you by your health care provider. Make sure you discuss any questions you have with your health care provider. Document Revised: 10/25/2017 Document Reviewed: 04/20/2016 Elsevier Patient Education  2020 [employer].

## 2020-06-06 ENCOUNTER — Encounter: Payer: Self-pay | Admitting: Family Medicine

## 2020-06-06 ENCOUNTER — Ambulatory Visit: Payer: BC Managed Care – PPO | Admitting: Family Medicine

## 2020-06-06 ENCOUNTER — Other Ambulatory Visit: Payer: Self-pay

## 2020-06-06 VITALS — BP 128/100 | HR 80 | Temp 98.4°F | Ht 66.0 in | Wt 207.0 lb

## 2020-06-06 DIAGNOSIS — I10 Essential (primary) hypertension: Secondary | ICD-10-CM

## 2020-06-06 MED ORDER — AMLODIPINE BESYLATE 5 MG PO TABS: 5.0000 mg | ORAL_TABLET | Freq: Every day | ORAL | 2 refills | Status: DC

## 2020-06-06 NOTE — Progress Notes (Signed)
Subjective:    Patient ID: Joseph Burns, male    DOB: 14-Jan-1991, 29 y.o.   MRN: [id number]  Chief Complaint  Patient presents with  . Hypertension    1 month f/u    HPI Patient was seen today for follow-up on blood pressure.  Patient brings in blood pressure cuff and readings from over the past month.  BP elevated at home, 148/106, 157/103, 149/113, 129/89, 140/97.  Pt endorses continuing to eat fast food as he is on paternity leave with for his daughter.  Now getting 5 hours of sleep/night.  Pt denies HAs, blurred vision, CP.  History reviewed. No pertinent past medical history.  No Known Allergies  ROS General: Denies fever, chills, night sweats, changes in weight, changes in appetite HEENT: Denies headaches, ear pain, changes in vision, rhinorrhea, sore throat CV: Denies CP, palpitations, SOB, orthopnea Pulm: Denies SOB, cough, wheezing GI: Denies abdominal pain, nausea, vomiting, diarrhea, constipation GU: Denies dysuria, hematuria, frequency, vaginal discharge Msk: Denies muscle cramps, joint pains Neuro: Denies weakness, numbness, tingling Skin: Denies rashes, bruising Psych: Denies depression, anxiety, hallucinations     Objective:    Blood pressure (!) 128/100, pulse 80, temperature 98.4 F (36.9 C), temperature source Other (Comment), height 5\' 6"  (1.676 m), weight 207 lb (93.9 kg), SpO2 97 %.   Gen. Pleasant, well-nourished, in no distress, normal affect   HEENT: /AT, face symmetric, conjunctiva clear, no scleral icterus, PERRLA, EOMI, nares patent without drainage Lungs: no accessory muscle use, CTAB, no wheezes or rales Cardiovascular: RRR, no m/r/g, no peripheral edema Musculoskeletal: No deformities, no cyanosis or clubbing, normal tone Neuro:  A&Ox3, CN II-XII intact, normal gait Skin:  Warm, no lesions/ rash   Wt Readings from Last 3 Encounters:  06/06/20 207 lb (93.9 kg)  05/05/20 209 lb (94.8 kg)  09/02/19 217 lb (98.4 kg)    Lab Results   Component Value Date   WBC 5.8 09/02/2019   HGB 15.3 09/02/2019   HCT 46.2 09/02/2019   PLT 207.0 09/02/2019   GLUCOSE 86 09/02/2019   CHOL 173 09/02/2019   TRIG 75.0 09/02/2019   HDL 47.60 09/02/2019   LDLCALC 111 (H) 09/02/2019   ALT 43 09/02/2019   AST 27 09/02/2019   NA 138 09/02/2019   K 4.2 09/02/2019   CL 103 09/02/2019   CREATININE 0.93 09/02/2019   BUN 11 09/02/2019   CO2 27 09/02/2019   HGBA1C 6.5 09/02/2019    Assessment/Plan:  Essential hypertension -Elevated -Discussed lifestyle modifications -We will start Norvasc 5 mg daily -Patient advised to return BP cuff to store and purchase a new cuff. -Continue monitoring BP at home daily and keep a log to bring with you to clinic. -Follow-up in 4 weeks, sooner if needed  - Plan: Basic metabolic panel, amLODipine (NORVASC) 5 MG tablet  F/u in 1 month, sooner if needed.  11/02/2019, MD

## 2020-06-06 NOTE — Addendum Note (Signed)
Addended by: Lerry Liner on: 06/06/2020 02:55 PM   Modules accepted: Orders

## 2020-06-06 NOTE — Patient Instructions (Signed)
   Managing Your Hypertension Hypertension is commonly called high blood pressure. This is when the force of your blood pressing against the walls of your arteries is too strong. Arteries are blood vessels that carry blood from your heart throughout your body. Hypertension forces the heart to work harder to pump blood, and may cause the arteries to become narrow or stiff. Having untreated or uncontrolled hypertension can cause heart attack, stroke, kidney disease, and other problems. What are blood pressure readings? A blood pressure reading consists of a higher number over a lower number. Ideally, your blood pressure should be below 120/80. The first ("top") number is called the systolic pressure. It is a measure of the pressure in your arteries as your heart beats. The second ("bottom") number is called the diastolic pressure. It is a measure of the pressure in your arteries as the heart relaxes. What does my blood pressure reading mean? Blood pressure is classified into four stages. Based on your blood pressure reading, your health care provider may use the following stages to determine what type of treatment you need, if any. Systolic pressure and diastolic pressure are measured in a unit called mm Hg. Normal  Systolic pressure: below 120.  Diastolic pressure: below 80. Elevated  Systolic pressure: 120-129.  Diastolic pressure: below 80. Hypertension stage 1  Systolic pressure: 130-139.  Diastolic pressure: 80-89. Hypertension stage 2  Systolic pressure: 140 or above.  Diastolic pressure: 90 or above. What health risks are associated with hypertension? Managing your hypertension is an important responsibility. Uncontrolled hypertension can lead to:  A heart attack.  A stroke.  A weakened blood vessel (aneurysm).  Heart failure.  Kidney damage.  Eye damage.  Metabolic syndrome.  Memory and concentration problems. What changes can I make to manage my  hypertension? Hypertension can be managed by making lifestyle changes and possibly by taking medicines. Your health care provider will help you make a plan to bring your blood pressure within a normal range. Eating and drinking   Eat a diet that is high in fiber and potassium, and low in salt (sodium), added sugar, and fat. An example eating plan is called the DASH (Dietary Approaches to Stop Hypertension) diet. To eat this way: ? Eat plenty of fresh fruits and vegetables. Try to fill half of your plate at each meal with fruits and vegetables. ? Eat whole grains, such as whole wheat pasta, brown rice, or whole grain bread. Fill about one quarter of your plate with whole grains. ? Eat low-fat diary products. ? Avoid fatty cuts of meat, processed or cured meats, and poultry with skin. Fill about one quarter of your plate with lean proteins such as fish, chicken without skin, beans, eggs, and tofu. ? Avoid premade and processed foods. These tend to be higher in sodium, added sugar, and fat.  Reduce your daily sodium intake. Most people with hypertension should eat less than 1,500 mg of sodium a day.  Limit alcohol intake to no more than 1 drink a day for nonpregnant women and 2 drinks a day for men. One drink equals 12 oz of beer, 5 oz of wine, or 1 oz of hard liquor. Lifestyle  Work with your health care provider to maintain a healthy body weight, or to lose weight. Ask what an ideal weight is for you.  Get at least 30 minutes of exercise that causes your heart to beat faster (aerobic exercise) most days of the week. Activities may include walking, swimming, or biking.    Include exercise to strengthen your muscles (resistance exercise), such as weight lifting, as part of your weekly exercise routine. Try to do these types of exercises for 30 minutes at least 3 days a week.  Do not use any products that contain nicotine or tobacco, such as cigarettes and e-cigarettes. If you need help quitting,  ask your health care provider.  Control any long-term (chronic) conditions you have, such as high cholesterol or diabetes. Monitoring  Monitor your blood pressure at home as told by your health care provider. Your personal target blood pressure may vary depending on your medical conditions, your age, and other factors.  Have your blood pressure checked regularly, as often as told by your health care provider. Working with your health care provider  Review all the medicines you take with your health care provider because there may be side effects or interactions.  Talk with your health care provider about your diet, exercise habits, and other lifestyle factors that may be contributing to hypertension.  Visit your health care provider regularly. Your health care provider can help you create and adjust your plan for managing hypertension. Will I need medicine to control my blood pressure? Your health care provider may prescribe medicine if lifestyle changes are not enough to get your blood pressure under control, and if:  Your systolic blood pressure is 130 or higher.  Your diastolic blood pressure is 80 or higher. Take medicines only as told by your health care provider. Follow the directions carefully. Blood pressure medicines must be taken as prescribed. The medicine does not work as well when you skip doses. Skipping doses also puts you at risk for problems. Contact a health care provider if:  You think you are having a reaction to medicines you have taken.  You have repeated (recurrent) headaches.  You feel dizzy.  You have swelling in your ankles.  You have trouble with your vision. Get help right away if:  You develop a severe headache or confusion.  You have unusual weakness or numbness, or you feel faint.  You have severe pain in your chest or abdomen.  You vomit repeatedly.  You have trouble breathing. Summary  Hypertension is when the force of blood pumping  through your arteries is too strong. If this condition is not controlled, it may put you at risk for serious complications.  Your personal target blood pressure may vary depending on your medical conditions, your age, and other factors. For most people, a normal blood pressure is less than 120/80.  Hypertension is managed by lifestyle changes, medicines, or both. Lifestyle changes include weight loss, eating a healthy, low-sodium diet, exercising more, and limiting alcohol. This information is not intended to replace advice given to you by your health care provider. Make sure you discuss any questions you have with your health care provider. Document Revised: 03/06/2019 Document Reviewed: 10/10/2016 Elsevier Patient Education  2020 [employer]  

## 2020-06-07 LAB — BASIC METABOLIC PANEL
BUN: 12 mg/dL (ref 7–25)
CO2: 26 mmol/L (ref 20–32)
Calcium: 9.4 mg/dL (ref 8.6–10.3)
Chloride: 104 mmol/L (ref 98–110)
Creat: 0.94 mg/dL (ref 0.60–1.35)
Glucose, Bld: 92 mg/dL (ref 65–99)
Potassium: 4.2 mmol/L (ref 3.5–5.3)
Sodium: 138 mmol/L (ref 135–146)

## 2020-06-07 LAB — T4, FREE: Free T4: 1.1 ng/dL (ref 0.8–1.8)

## 2020-06-07 LAB — TSH: TSH: 1.04 mIU/L (ref 0.40–4.50)

## 2020-07-08 ENCOUNTER — Encounter: Payer: Self-pay | Admitting: Family Medicine

## 2020-07-08 ENCOUNTER — Other Ambulatory Visit: Payer: Self-pay

## 2020-07-08 ENCOUNTER — Ambulatory Visit: Payer: BC Managed Care – PPO | Admitting: Family Medicine

## 2020-07-08 VITALS — BP 122/78 | HR 70 | Temp 98.0°F | Wt 212.0 lb

## 2020-07-08 DIAGNOSIS — I10 Essential (primary) hypertension: Secondary | ICD-10-CM

## 2020-07-08 MED ORDER — AMLODIPINE BESYLATE 5 MG PO TABS: 5.0000 mg | ORAL_TABLET | Freq: Every day | ORAL | 3 refills | Status: DC

## 2020-07-08 NOTE — Progress Notes (Signed)
Subjective:    Patient ID: Joseph Burns, male    DOB: 1991/10/23, 29 y.o.   MRN: [id number]  No chief complaint on file.   HPI Patient was seen today for f/u on HTN.  Pt started on Norvasc 5 mg.  States 'felt funny" the first day he took the med, but after that has not had any issues. Pt checking his bp at home occasionally. States stopped after it seemed like it was improving.  Pt notes BP was normal at recent dental visit.  Patient trying to drink more water.  Pt inquires about taking a liquid magnesium supplement that with his coworkers takes for blood pressure.  Pt helps to be able to come off blood pressure medication.  Denies LE edema, headaches, dizziness, chest pain, blurred vision.  History reviewed. No pertinent past medical history.  No Known Allergies  ROS General: Denies fever, chills, night sweats, changes in weight, changes in appetite HEENT: Denies headaches, ear pain, changes in vision, rhinorrhea, sore throat CV: Denies CP, palpitations, SOB, orthopnea Pulm: Denies SOB, cough, wheezing GI: Denies abdominal pain, nausea, vomiting, diarrhea, constipation GU: Denies dysuria, hematuria, frequency, vaginal discharge Msk: Denies muscle cramps, joint pains Neuro: Denies weakness, numbness, tingling Skin: Denies rashes, bruising Psych: Denies depression, anxiety, hallucinations     Objective:    Blood pressure 130/88, pulse 70, temperature 98 F (36.7 C), temperature source Oral, weight 212 lb (96.2 kg), SpO2 98 %.  Repeat BP 122/78  Gen. Pleasant, well-nourished, in no distress, normal affect   HEENT: [place]/AT, face symmetric, conjunctiva clear, no scleral icterus, PERRLA, EOMI, nares patent without drainage Lungs: no accessory muscle use Cardiovascular: RRR, no peripheral edema Neuro:  A&Ox3, CN II-XII intact, normal gait Skin:  Warm, no lesions/ rash   Wt Readings from Last 3 Encounters:  07/08/20 212 lb (96.2 kg)  06/06/20 207 lb (93.9 kg)  05/05/20 209 lb (94.8  kg)    Lab Results  Component Value Date   WBC 5.8 09/02/2019   HGB 15.3 09/02/2019   HCT 46.2 09/02/2019   PLT 207.0 09/02/2019   GLUCOSE 92 06/06/2020   CHOL 173 09/02/2019   TRIG 75.0 09/02/2019   HDL 47.60 09/02/2019   LDLCALC 111 (H) 09/02/2019   ALT 43 09/02/2019   AST 27 09/02/2019   NA 138 06/06/2020   K 4.2 06/06/2020   CL 104 06/06/2020   CREATININE 0.94 06/06/2020   BUN 12 06/06/2020   CO2 26 06/06/2020   TSH 1.04 06/06/2020   HGBA1C 6.5 09/02/2019    Assessment/Plan:  Essential hypertension  -Improving -Initial BP reading 130/88 -BP 122/78 -Continue lifestyle modifications -Continue Norvasc 5 mg daily -Patient encouraged to check BP at home regularly and keep a log to bring with him to clinic. - Plan: amLODipine (NORVASC) 5 MG tablet  F/u in 2-3 months.  Abbe Amsterdam, MD

## 2020-08-07 ENCOUNTER — Other Ambulatory Visit: Payer: Self-pay | Admitting: Family Medicine

## 2020-08-07 DIAGNOSIS — I10 Essential (primary) hypertension: Secondary | ICD-10-CM

## 2020-08-17 ENCOUNTER — Encounter: Payer: Self-pay | Admitting: Physician Assistant

## 2020-08-17 ENCOUNTER — Other Ambulatory Visit: Payer: Self-pay

## 2020-08-17 ENCOUNTER — Ambulatory Visit: Payer: BC Managed Care – PPO | Admitting: Physician Assistant

## 2020-08-17 DIAGNOSIS — L700 Acne vulgaris: Secondary | ICD-10-CM | POA: Diagnosis not present

## 2020-08-17 DIAGNOSIS — L730 Acne keloid: Secondary | ICD-10-CM | POA: Diagnosis not present

## 2020-08-17 MED ORDER — CLINDAMYCIN PHOSPHATE 1 % EX SOLN: Freq: Two times a day (BID) | CUTANEOUS | 2 refills | Status: DC

## 2020-08-17 MED ORDER — TRIAMCINOLONE ACETONIDE 10 MG/ML IJ SUSP
2.5000 mg | Freq: Once | INTRAMUSCULAR | Status: AC
  Administered 2020-08-17: 2.5 mg

## 2020-08-17 NOTE — Progress Notes (Signed)
   Follow up Visit  Subjective  Joseph Burns is a 29 y.o. male who presents for the following: Skin Problem (bumps on scalp x 3-56months- keep coming and going , Right cheek- keeps getting red- coming & going). Bumps on scalp coming and going for 6-8 months. Started after a hair cut. They itch. Has continued to come back. Scabs and heals and then comes back a few months later. He also has a recurrent pimple on the right cheek that never comes to a head. He was using a clinique product in it.   Objective  Well appearing patient in no apparent distress; mood and affect are within normal limits.  A focused examination was performed including face and scalp. Relevant physical exam findings are noted in the Assessment and Plan.   Objective  Scalp: Few hyperpigmented slightly thickened areas. No full keloids noted. No active inflamed areas today.  Objective  Nose: Cystic acne nodule right cheek  Assessment & Plan  Acne keloidalis Scalp  Pre wash scalp with bpo cleanser or cln wash. No tight hair cuts.  clindamycin (CLEOCIN T) 1 % external solution - Scalp  Acne vulgaris Nose  I attempted to lance this with an 18 gauge needle but got nothing. Decided to try intralesional injection instead.  Intralesional injection - Nose Location: right cheek  Informed Consent: Discussed risks (infection, pain, bleeding, bruising, thinning of the skin, loss of skin pigment,  Indentation, lack of resolution, and recurrence of lesion) and benefits of the procedure, as well as the alternatives. Informed consent was obtained. Preparation: The area was prepared in a standard fashion.   Procedure Details: An intralesional injection was performed with Kenalog 2.5 mg/cc. 0.1 cc in total were injected.  Total number of injections: 1  Plan: The patient was instructed on post-op care. Recommend OTC analgesia as needed for pain.

## 2020-09-07 ENCOUNTER — Other Ambulatory Visit: Payer: Self-pay

## 2020-09-07 ENCOUNTER — Ambulatory Visit (INDEPENDENT_AMBULATORY_CARE_PROVIDER_SITE_OTHER): Payer: BC Managed Care – PPO | Admitting: Family Medicine

## 2020-09-07 ENCOUNTER — Encounter: Payer: Self-pay | Admitting: Family Medicine

## 2020-09-07 VITALS — BP 120/98 | HR 70 | Temp 98.4°F | Ht 66.0 in | Wt 211.4 lb

## 2020-09-07 DIAGNOSIS — I10 Essential (primary) hypertension: Secondary | ICD-10-CM

## 2020-09-07 DIAGNOSIS — E119 Type 2 diabetes mellitus without complications: Secondary | ICD-10-CM

## 2020-09-07 DIAGNOSIS — Z0000 Encounter for general adult medical examination without abnormal findings: Secondary | ICD-10-CM | POA: Diagnosis not present

## 2020-09-07 DIAGNOSIS — R0789 Other chest pain: Secondary | ICD-10-CM

## 2020-09-07 DIAGNOSIS — L600 Ingrowing nail: Secondary | ICD-10-CM | POA: Insufficient documentation

## 2020-09-07 MED ORDER — AMLODIPINE BESYLATE 5 MG PO TABS: ORAL_TABLET | ORAL | 1 refills | Status: DC

## 2020-09-07 NOTE — Patient Instructions (Signed)
Preventive Care 19-31 Years Old, Male Preventive care refers to lifestyle choices and visits with your health care provider that can promote health and wellness. This includes:  A yearly physical exam. This is also called an annual well check.  Regular dental and eye exams.  Immunizations.  Screening for certain conditions.  Healthy lifestyle choices, such as eating a healthy diet, getting regular exercise, not using drugs or products that contain nicotine and tobacco, and limiting alcohol use. What can I expect for my preventive care visit? Physical exam Your health care provider will check:  Height and weight. These may be used to calculate body mass index (BMI), which is a measurement that tells if you are at a healthy weight.  Heart rate and blood pressure.  Your skin for abnormal spots. Counseling Your health care provider may ask you questions about:  Alcohol, tobacco, and drug use.  Emotional well-being.  Home and relationship well-being.  Sexual activity.  Eating habits.  Work and work Statistician. What immunizations do I need?  Influenza (flu) vaccine  This is recommended every year. Tetanus, diphtheria, and pertussis (Tdap) vaccine  You may need a Td booster every 10 years. Varicella (chickenpox) vaccine  You may need this vaccine if you have not already been vaccinated. Human papillomavirus (HPV) vaccine  If recommended by your health care provider, you may need three doses over 6 months. Measles, mumps, and rubella (MMR) vaccine  You may need at least one dose of MMR. You may also need a second dose. Meningococcal conjugate (MenACWY) vaccine  One dose is recommended if you are 45-76 years old and a Market researcher living in a residence hall, or if you have one of several medical conditions. You may also need additional booster doses. Pneumococcal conjugate (PCV13) vaccine  You may need this if you have certain conditions and were not  previously vaccinated. Pneumococcal polysaccharide (PPSV23) vaccine  You may need one or two doses if you smoke cigarettes or if you have certain conditions. Hepatitis A vaccine  You may need this if you have certain conditions or if you travel or work in places where you may be exposed to hepatitis A. Hepatitis B vaccine  You may need this if you have certain conditions or if you travel or work in places where you may be exposed to hepatitis B. Haemophilus influenzae type b (Hib) vaccine  You may need this if you have certain risk factors. You may receive vaccines as individual doses or as more than one vaccine together in one shot (combination vaccines). Talk with your health care provider about the risks and benefits of combination vaccines. What tests do I need? Blood tests  Lipid and cholesterol levels. These may be checked every 5 years starting at age 17.  Hepatitis C test.  Hepatitis B test. Screening   Diabetes screening. This is done by checking your blood sugar (glucose) after you have not eaten for a while (fasting).  Sexually transmitted disease (STD) testing. Talk with your health care provider about your test results, treatment options, and if necessary, the need for more tests. Follow these instructions at home: Eating and drinking   Eat a diet that includes fresh fruits and vegetables, whole grains, lean protein, and low-fat dairy products.  Take vitamin and mineral supplements as recommended by your health care provider.  Do not drink alcohol if your health care provider tells you not to drink.  If you drink alcohol: ? Limit how much you have to 0-2  drinks a day. ? Be aware of how much alcohol is in your drink. In the [place] one drink equals one 12 oz bottle of beer (355 mL), one 5 oz glass of wine (148 mL), or one 1 oz glass of hard liquor (44 mL). Lifestyle  Take daily care of your teeth and gums.  Stay active. Exercise for at least 30 minutes on 5 or  more days each week.  Do not use any products that contain nicotine or tobacco, such as cigarettes, e-cigarettes, and chewing tobacco. If you need help quitting, ask your health care provider.  If you are sexually active, practice safe sex. Use a condom or other form of protection to prevent STIs (sexually transmitted infections). What's next?  Go to your health care provider once a year for a well check visit.  Ask your health care provider how often you should have your eyes and teeth checked.  Stay up to date on all vaccines. This information is not intended to replace advice given to you by your health care provider. Make sure you discuss any questions you have with your health care provider. Document Revised: 11/06/2018 Document Reviewed: 11/06/2018 Elsevier Patient Education  2020 [employer].  Managing Your Hypertension Hypertension is commonly called high blood pressure. This is when the force of your blood pressing against the walls of your arteries is too strong. Arteries are blood vessels that carry blood from your heart throughout your body. Hypertension forces the heart to work harder to pump blood, and may cause the arteries to become narrow or stiff. Having untreated or uncontrolled hypertension can cause heart attack, stroke, kidney disease, and other problems. What are blood pressure readings? A blood pressure reading consists of a higher number over a lower number. Ideally, your blood pressure should be below 120/80. The first ("top") number is called the systolic pressure. It is a measure of the pressure in your arteries as your heart beats. The second ("bottom") number is called the diastolic pressure. It is a measure of the pressure in your arteries as the heart relaxes. What does my blood pressure reading mean? Blood pressure is classified into four stages. Based on your blood pressure reading, your health care provider may use the following stages to determine what type  of treatment you need, if any. Systolic pressure and diastolic pressure are measured in a unit called mm Hg. Normal  Systolic pressure: below 008.  Diastolic pressure: below 80. Elevated  Systolic pressure: 676-195.  Diastolic pressure: below 80. Hypertension stage 1  Systolic pressure: 093-267.  Diastolic pressure: 12-45. Hypertension stage 2  Systolic pressure: 809 or above.  Diastolic pressure: 90 or above. What health risks are associated with hypertension? Managing your hypertension is an important responsibility. Uncontrolled hypertension can lead to:  A heart attack.  A stroke.  A weakened blood vessel (aneurysm).  Heart failure.  Kidney damage.  Eye damage.  Metabolic syndrome.  Memory and concentration problems. What changes can I make to manage my hypertension? Hypertension can be managed by making lifestyle changes and possibly by taking medicines. Your health care provider will help you make a plan to bring your blood pressure within a normal range. Eating and drinking   Eat a diet that is high in fiber and potassium, and low in salt (sodium), added sugar, and fat. An example eating plan is called the DASH (Dietary Approaches to Stop Hypertension) diet. To eat this way: ? Eat plenty of fresh fruits and vegetables. Try to fill half  of your plate at each meal with fruits and vegetables. ? Eat whole grains, such as whole wheat pasta, brown rice, or whole grain bread. Fill about one quarter of your plate with whole grains. ? Eat low-fat diary products. ? Avoid fatty cuts of meat, processed or cured meats, and poultry with skin. Fill about one quarter of your plate with lean proteins such as fish, chicken without skin, beans, eggs, and tofu. ? Avoid premade and processed foods. These tend to be higher in sodium, added sugar, and fat.  Reduce your daily sodium intake. Most people with hypertension should eat less than 1,500 mg of sodium a day.  Limit alcohol  intake to no more than 1 drink a day for nonpregnant women and 2 drinks a day for men. One drink equals 12 oz of beer, 5 oz of wine, or 1 oz of hard liquor. Lifestyle  Work with your health care provider to maintain a healthy body weight, or to lose weight. Ask what an ideal weight is for you.  Get at least 30 minutes of exercise that causes your heart to beat faster (aerobic exercise) most days of the week. Activities may include walking, swimming, or biking.  Include exercise to strengthen your muscles (resistance exercise), such as weight lifting, as part of your weekly exercise routine. Try to do these types of exercises for 30 minutes at least 3 days a week.  Do not use any products that contain nicotine or tobacco, such as cigarettes and e-cigarettes. If you need help quitting, ask your health care provider.  Control any long-term (chronic) conditions you have, such as high cholesterol or diabetes. Monitoring  Monitor your blood pressure at home as told by your health care provider. Your personal target blood pressure may vary depending on your medical conditions, your age, and other factors.  Have your blood pressure checked regularly, as often as told by your health care provider. Working with your health care provider  Review all the medicines you take with your health care provider because there may be side effects or interactions.  Talk with your health care provider about your diet, exercise habits, and other lifestyle factors that may be contributing to hypertension.  Visit your health care provider regularly. Your health care provider can help you create and adjust your plan for managing hypertension. Will I need medicine to control my blood pressure? Your health care provider may prescribe medicine if lifestyle changes are not enough to get your blood pressure under control, and if:  Your systolic blood pressure is 130 or higher.  Your diastolic blood pressure is 80 or  higher. Take medicines only as told by your health care provider. Follow the directions carefully. Blood pressure medicines must be taken as prescribed. The medicine does not work as well when you skip doses. Skipping doses also puts you at risk for problems. Contact a health care provider if:  You think you are having a reaction to medicines you have taken.  You have repeated (recurrent) headaches.  You feel dizzy.  You have swelling in your ankles.  You have trouble with your vision. Get help right away if:  You develop a severe headache or confusion.  You have unusual weakness or numbness, or you feel faint.  You have severe pain in your chest or abdomen.  You vomit repeatedly.  You have trouble breathing. Summary  Hypertension is when the force of blood pumping through your arteries is too strong. If this condition is not controlled,  it may put you at risk for serious complications.  Your personal target blood pressure may vary depending on your medical conditions, your age, and other factors. For most people, a normal blood pressure is less than 120/80.  Hypertension is managed by lifestyle changes, medicines, or both. Lifestyle changes include weight loss, eating a healthy, low-sodium diet, exercising more, and limiting alcohol. This information is not intended to replace advice given to you by your health care provider. Make sure you discuss any questions you have with your health care provider. Document Revised: 03/06/2019 Document Reviewed: 10/10/2016 Elsevier Patient Education  2020 [employer].  Diabetes Basics  Diabetes (diabetes mellitus) is a long-term (chronic) disease. It occurs when the body does not properly use sugar (glucose) that is released from food after you eat. Diabetes may be caused by one or both of these problems:  Your pancreas does not make enough of a hormone called insulin.  Your body does not react in a normal way to insulin that it  makes. Insulin lets sugars (glucose) go into cells in your body. This gives you energy. If you have diabetes, sugars cannot get into cells. This causes high blood sugar (hyperglycemia). Follow these instructions at home: How is diabetes treated? You may need to take insulin or other diabetes medicines daily to keep your blood sugar in balance. Take your diabetes medicines every day as told by your doctor. List your diabetes medicines here: Diabetes medicines  Name of medicine: ______________________________ ? Amount (dose): _______________ Time (a.m./p.m.): _______________ Notes: ___________________________________  Name of medicine: ______________________________ ? Amount (dose): _______________ Time (a.m./p.m.): _______________ Notes: ___________________________________  Name of medicine: ______________________________ ? Amount (dose): _______________ Time (a.m./p.m.): _______________ Notes: ___________________________________ If you use insulin, you will learn how to give yourself insulin by injection. You may need to adjust the amount based on the food that you eat. List the types of insulin you use here: Insulin  Insulin type: ______________________________ ? Amount (dose): _______________ Time (a.m./p.m.): _______________ Notes: ___________________________________  Insulin type: ______________________________ ? Amount (dose): _______________ Time (a.m./p.m.): _______________ Notes: ___________________________________  Insulin type: ______________________________ ? Amount (dose): _______________ Time (a.m./p.m.): _______________ Notes: ___________________________________  Insulin type: ______________________________ ? Amount (dose): _______________ Time (a.m./p.m.): _______________ Notes: ___________________________________  Insulin type: ______________________________ ? Amount (dose): _______________ Time (a.m./p.m.): _______________ Notes:  ___________________________________ How do I manage my blood sugar?  Check your blood sugar levels using a blood glucose monitor as directed by your doctor. Your doctor will set treatment goals for you. Generally, you should have these blood sugar levels:  Before meals (preprandial): 80-130 mg/dL (4.4-7.2 mmol/L).  After meals (postprandial): below 180 mg/dL (10 mmol/L).  A1c level: less than 7%. Write down the times that you will check your blood sugar levels: Blood sugar checks  Time: _______________ Notes: ___________________________________  Time: _______________ Notes: ___________________________________  Time: _______________ Notes: ___________________________________  Time: _______________ Notes: ___________________________________  Time: _______________ Notes: ___________________________________  Time: _______________ Notes: ___________________________________  What do I need to know about low blood sugar? Low blood sugar is called hypoglycemia. This is when blood sugar is at or below 70 mg/dL (3.9 mmol/L). Symptoms may include:  Feeling: ? Hungry. ? Worried or nervous (anxious). ? Sweaty and clammy. ? Confused. ? Dizzy. ? Sleepy. ? Sick to your stomach (nauseous).  Having: ? A fast heartbeat. ? A headache. ? A change in your vision. ? Tingling or no feeling (numbness) around the mouth, lips, or tongue. ? Jerky movements that you cannot control (seizure).  Having trouble with: ? Moving (  coordination). ? Sleeping. ? Passing out (fainting). ? Getting upset easily (irritability). Treating low blood sugar To treat low blood sugar, eat or drink something sugary right away. If you can think clearly and swallow safely, follow the 15:15 rule:  Take 15 grams of a fast-acting carb (carbohydrate). Talk with your doctor about how much you should take.  Some fast-acting carbs are: ? Sugar tablets (glucose pills). Take 3-4 glucose pills. ? 6-8 pieces of hard  candy. ? 4-6 oz (120-150 mL) of fruit juice. ? 4-6 oz (120-150 mL) of regular (not diet) soda. ? 1 Tbsp (15 mL) honey or sugar.  Check your blood sugar 15 minutes after you take the carb.  If your blood sugar is still at or below 70 mg/dL (3.9 mmol/L), take 15 grams of a carb again.  If your blood sugar does not go above 70 mg/dL (3.9 mmol/L) after 3 tries, get help right away.  After your blood sugar goes back to normal, eat a meal or a snack within 1 hour. Treating very low blood sugar If your blood sugar is at or below 54 mg/dL (3 mmol/L), you have very low blood sugar (severe hypoglycemia). This is an emergency. Do not wait to see if the symptoms will go away. Get medical help right away. Call your local emergency services (911 in the [place] Do not drive yourself to the hospital. Questions to ask your health care provider  Do I need to meet with a diabetes educator?  What equipment will I need to care for myself at home?  What diabetes medicines do I need? When should I take them?  How often do I need to check my blood sugar?  What number can I call if I have questions?  When is my next doctor's visit?  Where can I find a support group for people with diabetes? Where to find more information  American Diabetes Association: www.diabetes.org  American Association of Diabetes Educators: www.diabeteseducator.org/patient-resources Contact a doctor if:  Your blood sugar is at or above 240 mg/dL (13.3 mmol/L) for 2 days in a row.  You have been sick or have had a fever for 2 days or more, and you are not getting better.  You have any of these problems for more than 6 hours: ? You cannot eat or drink. ? You feel sick to your stomach (nauseous). ? You throw up (vomit). ? You have watery poop (diarrhea). Get help right away if:  Your blood sugar is lower than 54 mg/dL (3 mmol/L).  You get confused.  You have trouble: ? Thinking clearly. ? Breathing. Summary  Diabetes  (diabetes mellitus) is a long-term (chronic) disease. It occurs when the body does not properly use sugar (glucose) that is released from food after digestion.  Take insulin and diabetes medicines as told.  Check your blood sugar every day, as often as told.  Keep all follow-up visits as told by your doctor. This is important. This information is not intended to replace advice given to you by your health care provider. Make sure you discuss any questions you have with your health care provider. Document Revised: 08/05/2019 Document Reviewed: 02/14/2018 Elsevier Patient Education  [place].

## 2020-09-07 NOTE — Progress Notes (Signed)
Subjective:     Joseph Burns is a 29 y.o. male and is here for a comprehensive physical exam.  Pt with intermittent chest soreness.  States having difficulty explaining the sensation.  Pt mentions wanting to be healthy for his infant daughter.  Pt inquires about cancer screening.  Pt thinks his grandfather had prostate cancer.  Not really checking bp at home. Taking norvasc 5 mg.  Notes pain in side of L toe wear toenail is digging into the skin.  Pt tried to cut the side of his toenail to stop it from hurting.  Pt denies bloody drainage, purulent drainage, erythema, or induration at site.  Wears steel toe shoe at work.  Social History   Socioeconomic History  . Marital status: Single    Spouse name: Not on file  . Number of children: Not on file  . Years of education: Not on file  . Highest education level: Not on file  Occupational History  . Not on file  Tobacco Use  . Smoking status: Never Smoker  . Smokeless tobacco: Never Used  Substance and Sexual Activity  . Alcohol use: Yes    Comment: 6 cans of beer weekly on average  . Drug use: Yes    Frequency: 2.0 times per week    Types: Marijuana  . Sexual activity: Yes    Partners: Female  Other Topics Concern  . Not on file  Social History Narrative   [place]    Lives in [place]   Single   No children   Fun: movies, games, hanging out with friends   Social Determinants of Health   Financial Resource Strain:   . Difficulty of Paying Living Expenses: Not on file  Food Insecurity:   . Worried About Charity fundraiser in the Last Year: Not on file  . Ran Out of Food in the Last Year: Not on file  Transportation Needs:   . Lack of Transportation (Medical): Not on file  . Lack of Transportation (Non-Medical): Not on file  Physical Activity:   . Days of Exercise per Week: Not on file  . Minutes of Exercise per Session: Not on file  Stress:   . Feeling of Stress : Not on file  Social Connections:   . Frequency  of Communication with Friends and Family: Not on file  . Frequency of Social Gatherings with Friends and Family: Not on file  . Attends Religious Services: Not on file  . Active Member of Clubs or Organizations: Not on file  . Attends Archivist Meetings: Not on file  . Marital Status: Not on file  Intimate Partner Violence:   . Fear of Current or Ex-Partner: Not on file  . Emotionally Abused: Not on file  . Physically Abused: Not on file  . Sexually Abused: Not on file   Health Maintenance  Topic Date Due  . Hepatitis C Screening  Never done  . PNEUMOCOCCAL POLYSACCHARIDE VACCINE AGE 38-64 HIGH RISK  Never done  . FOOT EXAM  Never done  . OPHTHALMOLOGY EXAM  Never done  . URINE MICROALBUMIN  Never done  . COVID-19 Vaccine (1) Never done  . HEMOGLOBIN A1C  03/02/2020  . TETANUS/TDAP  03/01/2027  . HIV Screening  Completed  . INFLUENZA VACCINE  Discontinued    The following portions of the patient's history were reviewed and updated as appropriate: allergies, current medications, past family history, past medical history, past social history, past surgical history and problem  list.  Review of Systems Pertinent items noted in HPI and remainder of comprehensive ROS otherwise negative.   Objective:    BP (!) 120/98 Comment: Rechecked after the visit  Pulse 70   Temp 98.4 F (36.9 C) (Oral)   Ht '5\' 6"'  (1.676 m)   Wt 211 lb 6.4 oz (95.9 kg)   SpO2 99%   BMI 34.12 kg/m  General appearance: alert, cooperative and no distress Head: Normocephalic, without obvious abnormality, atraumatic Eyes: conjunctivae/corneas clear. PERRL, EOM's intact. Fundi benign. Ears: normal TM's and external ear canals both ears Nose: Nares normal. Septum midline. Mucosa normal. No drainage or sinus tenderness. Throat: lips, mucosa, and tongue normal; teeth and gums normal Neck: no adenopathy, no carotid bruit, no JVD, supple, symmetrical, trachea midline and thyroid not enlarged, symmetric,  no tenderness/mass/nodules Lungs: clear to auscultation bilaterally Heart: regular rate and rhythm, S1, S2 normal, no murmur, click, rub or gallop Abdomen: soft, non-tender; bowel sounds normal; no masses,  no organomegaly Extremities: extremities normal, atraumatic, no cyanosis or edema Pulses: 2+ and symmetric Skin: Skin color, texture, turgor normal. No rashes or lesions  Hallux toe nail L great toe without erythema, drainage, or induration.  L great toe nail with mild deformity- tent-like peak in middle of nail. Lymph nodes: Cervical, supraclavicular, and axillary nodes normal. Neurologic: Alert and oriented X 3, normal strength and tone. Normal symmetric reflexes. Normal coordination and gait    Diabetic Foot Exam - Simple   Simple Foot Form Diabetic Foot exam was performed with the following findings: Yes 09/07/2020 12:02 PM  Visual Inspection No deformities, no ulcerations, no other skin breakdown bilaterally: Yes See comments: Yes Sensation Testing Intact to touch and monofilament testing bilaterally: Yes Pulse Check Posterior Tibialis and Dorsalis pulse intact bilaterally: Yes Comments Hallux toenail of L great toe.       Assessment:    Healthy male exam.      Plan:     Anticipatory guidance given including wearing seatbelts, smoke detectors in the home, increasing physical activity, increasing p.o. intake of water and vegetables. -will obtain labs -next CPE in 1 yr See After Visit Summary for Counseling Recommendations    Essential hypertension  -Diastolic elevated -repeat bp -Continue Norvasc 5 mg -Continue lifestyle modifications -Patient encouraged to check BP at home more regularly and bring log to clinic - Plan: CMP with eGFR([facility]), CMP with eGFR([facility])  Ingrown toenail of left foot  -Discussed treatment options -Advised not to cut along side hof nail - Plan: Ambulatory referral to Podiatry  Controlled type 2 diabetes mellitus without complication,  without long-term current use of insulin ([place])  -hgb A1C 6.5% on 09/02/19 -Foot exam done this visit -Continue lifestyle modifications -For elevation in A1c consider medication such as Metformin. - Plan: Hemoglobin A1c, Microalbumin/Creatinine Ratio, Urine, Lipid panel  Chest wall discomfort  -resolved -Discussed possible causes including costochondritis, intercostal muscle strain, pec strain - Plan: CBC with Differential/Platelet, CMP with eGFR([facility]), TSH, T4, free, Lipid panel  F/u prn  [name], MD

## 2020-09-08 LAB — COMPLETE METABOLIC PANEL WITH GFR
AG Ratio: 1.5 (calc) (ref 1.0–2.5)
ALT: 31 U/L (ref 9–46)
AST: 23 U/L (ref 10–40)
Albumin: 4.8 g/dL (ref 3.6–5.1)
Alkaline phosphatase (APISO): 64 U/L (ref 36–130)
BUN: 12 mg/dL (ref 7–25)
CO2: 26 mmol/L (ref 20–32)
Calcium: 9.3 mg/dL (ref 8.6–10.3)
Chloride: 104 mmol/L (ref 98–110)
Creat: 0.88 mg/dL (ref 0.60–1.35)
GFR, Est African American: 133 mL/min/{1.73_m2} (ref >=60)
GFR, Est Non African American: 114 mL/min/{1.73_m2} (ref >=60)
Globulin: 3.1 g/dL (calc) (ref 1.9–3.7)
Glucose, Bld: 107 mg/dL — ABNORMAL HIGH (ref 65–99)
Potassium: 4.1 mmol/L (ref 3.5–5.3)
Sodium: 138 mmol/L (ref 135–146)
Total Bilirubin: 0.5 mg/dL (ref 0.2–1.2)
Total Protein: 7.9 g/dL (ref 6.1–8.1)

## 2020-09-08 LAB — LIPID PANEL
Cholesterol: 174 mg/dL (ref <200)
HDL: 57 mg/dL (ref >=40)
LDL Cholesterol (Calc): 101 mg/dL (calc) — ABNORMAL HIGH
Non-HDL Cholesterol (Calc): 117 mg/dL (calc) (ref <130)
Total CHOL/HDL Ratio: 3.1 (calc) (ref <5.0)
Triglycerides: 69 mg/dL (ref <150)

## 2020-09-08 LAB — MICROALBUMIN / CREATININE URINE RATIO
Creatinine, Urine: 218 mg/dL (ref 20–320)
Microalb Creat Ratio: 3 mcg/mg creat (ref <30)
Microalb, Ur: 0.6 mg/dL

## 2020-09-08 LAB — CBC WITH DIFFERENTIAL/PLATELET
Absolute Monocytes: 657 cells/uL (ref 200–950)
Basophils Absolute: 40 cells/uL (ref 0–200)
Basophils Relative: 0.6 %
Eosinophils Absolute: 235 cells/uL (ref 15–500)
Eosinophils Relative: 3.5 %
HCT: 45.6 % (ref 38.5–50.0)
Hemoglobin: 14.9 g/dL (ref 13.2–17.1)
Lymphs Abs: 1461 cells/uL (ref 850–3900)
MCH: 26.8 pg — ABNORMAL LOW (ref 27.0–33.0)
MCHC: 32.7 g/dL (ref 32.0–36.0)
MCV: 82.2 fL (ref 80.0–100.0)
MPV: 11.4 fL (ref 7.5–12.5)
Monocytes Relative: 9.8 %
Neutro Abs: 4308 cells/uL (ref 1500–7800)
Neutrophils Relative %: 64.3 %
Platelets: 194 10*3/uL (ref 140–400)
RBC: 5.55 10*6/uL (ref 4.20–5.80)
RDW: 13.7 % (ref 11.0–15.0)
Total Lymphocyte: 21.8 %
WBC: 6.7 10*3/uL (ref 3.8–10.8)

## 2020-09-08 LAB — HEMOGLOBIN A1C
Hgb A1c MFr Bld: 4.7 % of total Hgb (ref <5.7)
Mean Plasma Glucose: 88 (calc)
eAG (mmol/L): 4.9 (calc)

## 2020-09-08 LAB — T4, FREE: Free T4: 1.5 ng/dL (ref 0.8–1.8)

## 2020-09-08 LAB — TSH: TSH: 0.96 mIU/L (ref 0.40–4.50)

## 2020-09-20 ENCOUNTER — Ambulatory Visit: Payer: Self-pay | Admitting: Podiatry

## 2020-09-29 ENCOUNTER — Ambulatory Visit: Payer: Self-pay | Admitting: Podiatry

## 2020-10-06 ENCOUNTER — Other Ambulatory Visit: Payer: Self-pay

## 2020-10-06 ENCOUNTER — Encounter: Payer: Self-pay | Admitting: Podiatry

## 2020-10-06 ENCOUNTER — Ambulatory Visit: Payer: BC Managed Care – PPO | Admitting: Podiatry

## 2020-10-06 DIAGNOSIS — L600 Ingrowing nail: Secondary | ICD-10-CM

## 2020-10-06 DIAGNOSIS — M79675 Pain in left toe(s): Secondary | ICD-10-CM

## 2020-10-06 DIAGNOSIS — M79674 Pain in right toe(s): Secondary | ICD-10-CM | POA: Diagnosis not present

## 2020-10-06 NOTE — Patient Instructions (Signed)
Soak Instructions    THE DAY AFTER THE PROCEDURE  Place 1/4 cup of epsom salts in a quart of warm tap water.  Submerge your foot or feet with outer bandage intact for the initial soak; this will allow the bandage to become moist and wet for easy lift off.  Once you remove your bandage, continue to soak in the solution for 20 minutes.  This soak should be done twice a day.  Next, remove your foot or feet from solution, blot dry the affected area and cover.  You may use a band aid large enough to cover the area or use gauze and tape.  Apply other medications to the area as directed by the doctor such as polysporin neosporin.  IF YOUR SKIN BECOMES IRRITATED WHILE USING THESE INSTRUCTIONS, IT IS OKAY TO SWITCH TO  WHITE VINEGAR AND WATER. Or you may use antibacterial soap and water to keep the toe clean  Monitor for any signs/symptoms of infection. Call the office immediately if any occur or go directly to the emergency room. Call with any questions/concerns.   Ingrown Toenail An ingrown toenail occurs when the corner or sides of a toenail grow into the surrounding skin. This causes discomfort and pain. The big toe is most commonly affected, but any of the toes can be affected. If an ingrown toenail is not treated, it can become infected. What are the causes? This condition may be caused by:  Wearing shoes that are too small or tight.  An injury, such as stubbing your toe or having your toe stepped on.  Improper cutting or care of your toenails.  Having nail or foot abnormalities that were present from birth (congenital abnormalities), such as having a nail that is too big for your toe. What increases the risk? The following factors may make you more likely to develop ingrown toenails:  Age. Nails tend to get thicker with age, so ingrown nails are more common among older people.  Cutting your toenails incorrectly, such as cutting them very short or cutting them unevenly. An ingrown toenail  is more likely to get infected if you have:  Diabetes.  Blood flow (circulation) problems. What are the signs or symptoms? Symptoms of an ingrown toenail may include:  Pain, soreness, or tenderness.  Redness.  Swelling.  Hardening of the skin that surrounds the toenail. Signs that an ingrown toenail may be infected include:  Fluid or pus.  Symptoms that get worse instead of better. How is this diagnosed? An ingrown toenail may be diagnosed based on your medical history, your symptoms, and a physical exam. If you have fluid or blood coming from your toenail, a sample may be collected to test for the specific type of bacteria that is causing the infection. How is this treated? Treatment depends on how severe your ingrown toenail is. You may be able to care for your toenail at home.  If you have an infection, you may be prescribed antibiotic medicines.  If you have fluid or pus draining from your toenail, your health care provider may drain it.  If you have trouble walking, you may be given crutches to use.  If you have a severe or infected ingrown toenail, you may need a procedure to remove part or all of the nail. Follow these instructions at home: Foot care   Do not pick at your toenail or try to remove it yourself.  Soak your foot in warm, soapy water. Do this for 20 minutes, 3 times a   day, or as often as told by your health care provider. This helps to keep your toe clean and keep your skin soft.  Wear shoes that fit well and are not too tight. Your health care provider may recommend that you wear open-toed shoes while you heal.  Trim your toenails regularly and carefully. Cut your toenails straight across to prevent injury to the skin at the corners of the toenail. Do not cut your nails in a curved shape.  Keep your feet clean and dry to help prevent infection. Medicines  Take over-the-counter and prescription medicines only as told by your health care  provider.  If you were prescribed an antibiotic, take it as told by your health care provider. Do not stop taking the antibiotic even if you start to feel better. Activity  Return to your normal activities as told by your health care provider. Ask your health care provider what activities are safe for you.  Avoid activities that cause pain. General instructions  If your health care provider told you to use crutches to help you move around, use them as instructed.  Keep all follow-up visits as told by your health care provider. This is important. Contact a health care provider if:  You have more redness, swelling, pain, or other symptoms that do not improve with treatment.  You have fluid, blood, or pus coming from your toenail. Get help right away if:  You have a red streak on your skin that starts at your foot and spreads up your leg.  You have a fever. Summary  An ingrown toenail occurs when the corner or sides of a toenail grow into the surrounding skin. This causes discomfort and pain. The big toe is most commonly affected, but any of the toes can be affected.  If an ingrown toenail is not treated, it can become infected.  Fluid or pus draining from your toenail is a sign of infection. Your health care provider may need to drain it. You may be given antibiotics to treat the infection.  Trimming your toenails regularly and properly can help you prevent an ingrown toenail. This information is not intended to replace advice given to you by your health care provider. Make sure you discuss any questions you have with your health care provider. Document Revised: 03/06/2019 Document Reviewed: 07/31/2017 Elsevier Patient Education  2020 Elsevier Inc.  

## 2020-10-10 ENCOUNTER — Encounter: Payer: Self-pay | Admitting: Family Medicine

## 2020-10-10 ENCOUNTER — Other Ambulatory Visit: Payer: Self-pay

## 2020-10-10 ENCOUNTER — Ambulatory Visit (INDEPENDENT_AMBULATORY_CARE_PROVIDER_SITE_OTHER): Payer: BC Managed Care – PPO | Admitting: Family Medicine

## 2020-10-10 VITALS — BP 120/85 | HR 87 | Temp 98.7°F | Wt 219.2 lb

## 2020-10-10 DIAGNOSIS — I10 Essential (primary) hypertension: Secondary | ICD-10-CM

## 2020-10-10 NOTE — Progress Notes (Signed)
Subjective:    Patient ID: Joseph Burns, male    DOB: March 11, 1991, 29 y.o.   MRN: [id number]  No chief complaint on file.   HPI Patient was seen today for follow-up on blood pressure.  Patient taking Norvasc 5 mg daily.  Drinking more water, but states could do better.  Purchased a new bp cuff. BP at home 124/88.  Patient denies chest pain, changes in vision, LE edema.  Patient notes his 39-month-old daughter is going to sleep regression.  Patient states week was rough as he was up around 3 and 4 AM with her.  Inquires about DX of type II DM noted on chart.  Also inquires of drinking hot water with lemon and drinking pomegranate juice BID will help blood pressure.  No past medical history on file.  No Known Allergies  ROS General: Denies fever, chills, night sweats, changes in weight, changes in appetite HEENT: Denies headaches, ear pain, changes in vision, rhinorrhea, sore throat CV: Denies CP, palpitations, SOB, orthopnea Pulm: Denies SOB, cough, wheezing GI: Denies abdominal pain, nausea, vomiting, diarrhea, constipation GU: Denies dysuria, hematuria, frequency, vaginal discharge Msk: Denies muscle cramps, joint pains Neuro: Denies weakness, numbness, tingling Skin: Denies rashes, bruising Psych: Denies depression, anxiety, hallucinations     Objective:    Blood pressure 120/85, pulse 87, temperature 98.7 F (37.1 C), temperature source Oral, weight 219 lb 3.2 oz (99.4 kg), SpO2 97 %.  Gen. Pleasant, well-nourished, in no distress, normal affect   HEENT: [place]/AT, face symmetric, conjunctiva clear, no scleral icterus, PERRLA, EOMI, nares patent without drainage, Lungs: no accessory muscle use Cardiovascular: RRR, no peripheral edema Musculoskeletal: No deformities, no cyanosis or clubbing, normal tone Neuro:  A&Ox3, CN II-XII intact, normal gait Skin:  Warm, no lesions/ rash   Wt Readings from Last 3 Encounters:  09/07/20 211 lb 6.4 oz (95.9 kg)  07/08/20 212 lb (96.2 kg)   06/06/20 207 lb (93.9 kg)    Lab Results  Component Value Date   WBC 6.7 09/07/2020   HGB 14.9 09/07/2020   HCT 45.6 09/07/2020   PLT 194 09/07/2020   GLUCOSE 107 (H) 09/07/2020   CHOL 174 09/07/2020   TRIG 69 09/07/2020   HDL 57 09/07/2020   LDLCALC 101 (H) 09/07/2020   ALT 31 09/07/2020   AST 23 09/07/2020   NA 138 09/07/2020   K 4.1 09/07/2020   CL 104 09/07/2020   CREATININE 0.88 09/07/2020   BUN 12 09/07/2020   CO2 26 09/07/2020   TSH 0.96 09/07/2020   HGBA1C 4.7 09/07/2020   MICROALBUR 0.6 09/07/2020    Assessment/Plan:  Essential hypertension -Improving -Repeat BP by this provider 120/85 -Decreased sleep likely contributing to BP elevation -Continue lifestyle modifications including limiting sodium intake and increasing p.o. intake of water and physical activity -Continue Norvasc 5 mg daily -Patient encouraged to continue checking BP at home -Continue to monitor  F/u in 3 months, sooner if needed  Abbe Amsterdam, MD

## 2020-10-11 NOTE — Progress Notes (Signed)
Subjective:   Patient ID: Joseph Burns, male   DOB: 29 y.o.   MRN: [id number]   HPI 29 year old male presents the office today for concerns of toenail issue to his left big toe.  States the nail is growing up and also gets ingrown is tender.  Denies any drainage or pus or any swelling or redness.  He has no other concerns today.  No recent injury or treatment.   Review of Systems  All other systems reviewed and are negative.  History reviewed. No pertinent past medical history.  Past Surgical History:  Procedure Laterality Date  . HIP FRACTURE SURGERY Right    2010  . wrist surgery Left    2006     Current Outpatient Medications:  .  amLODipine (NORVASC) 5 MG tablet, TAKE 1 TABLET(5 MG) BY MOUTH DAILY, Disp: 90 tablet, Rfl: 1  No Known Allergies       Objective:  Physical Exam  General: AAO x3, NAD  Dermatological: Hallux toenails are dystrophic with Incurvation present to both the medial lateral aspects of any signs of infection.  No open lesions today.  Vascular: Dorsalis Pedis artery and Posterior Tibial artery pedal pulses are 2/4 bilateral with immedate capillary fill time. There is no pain with calf compression, swelling, warmth, erythema.   Neruologic: Grossly intact via light touch bilateral.   Musculoskeletal: No gross boney pedal deformities bilateral. No pain, crepitus, or limitation noted with foot and ankle range of motion bilateral. Muscular strength 5/5 in all groups tested bilateral.  Gait: Unassisted, Nonantalgic.       Assessment:   29 year old male with ingrown toenails    Plan:  -Treatment options discussed including all alternatives, risks, and complications -Etiology of symptoms were discussed -We discussed with conservative as well as surgical options.  Is having so hold off on partial nail avulsions.  Was able to sharply debride the symptomatic portion of the ingrown nails any complications or bleeding.  Should symptoms continue we will  likely proceed with a partial nail avulsion.  He can do Epson salt soaks and recommend antibiotic ointment daily.  Monitor for any signs or symptoms of infection, reoccurrence of pain.  Vivi Barrack DPM

## 2020-12-06 ENCOUNTER — Other Ambulatory Visit: Payer: BC Managed Care – PPO

## 2020-12-06 DIAGNOSIS — Z20822 Contact with and (suspected) exposure to covid-19: Secondary | ICD-10-CM | POA: Diagnosis not present

## 2020-12-07 ENCOUNTER — Other Ambulatory Visit: Payer: BC Managed Care – PPO

## 2020-12-08 LAB — SARS-COV-2, NAA 2 DAY TAT

## 2020-12-08 LAB — NOVEL CORONAVIRUS, NAA: SARS-CoV-2, NAA: DETECTED — AB

## 2020-12-09 ENCOUNTER — Telehealth (HOSPITAL_COMMUNITY): Payer: Self-pay | Admitting: Internal Medicine

## 2020-12-09 ENCOUNTER — Encounter (HOSPITAL_COMMUNITY): Payer: Self-pay

## 2020-12-09 NOTE — Telephone Encounter (Signed)
Called to discuss with patient about COVID-19 symptoms and the use of one of the available treatments for those with mild to moderate Covid symptoms and at a high risk of hospitalization.  Pt appears to qualify for outpatient treatment due to co-morbid conditions and/or a member of an at-risk group in accordance with the FDA Emergency Use Authorization.    Symptom onset: 1/10 Vaccinated: no Booster? no Immunocompromised? no Qualifiers: htn, DM2, BMI  Pt with very mild symptoms right now. Today would be last day to qualify for oral antiviral. He qualifies for IV MAB, but declines at this time. He is aware of 7 day window. Hotline number given should he change his mind and more info sent via MyChart.   Cyndee Brightly, NP [facility]

## 2020-12-14 IMAGING — DX DG LUMBAR SPINE COMPLETE 4+V
5 series · 5 of 5 positions shown · non-contrast
Comparison: 02/28/2017

CLINICAL DATA: Chronic low back pain.

EXAM:
LUMBAR SPINE - COMPLETE 4+ VIEW

[lumbar spine ap]
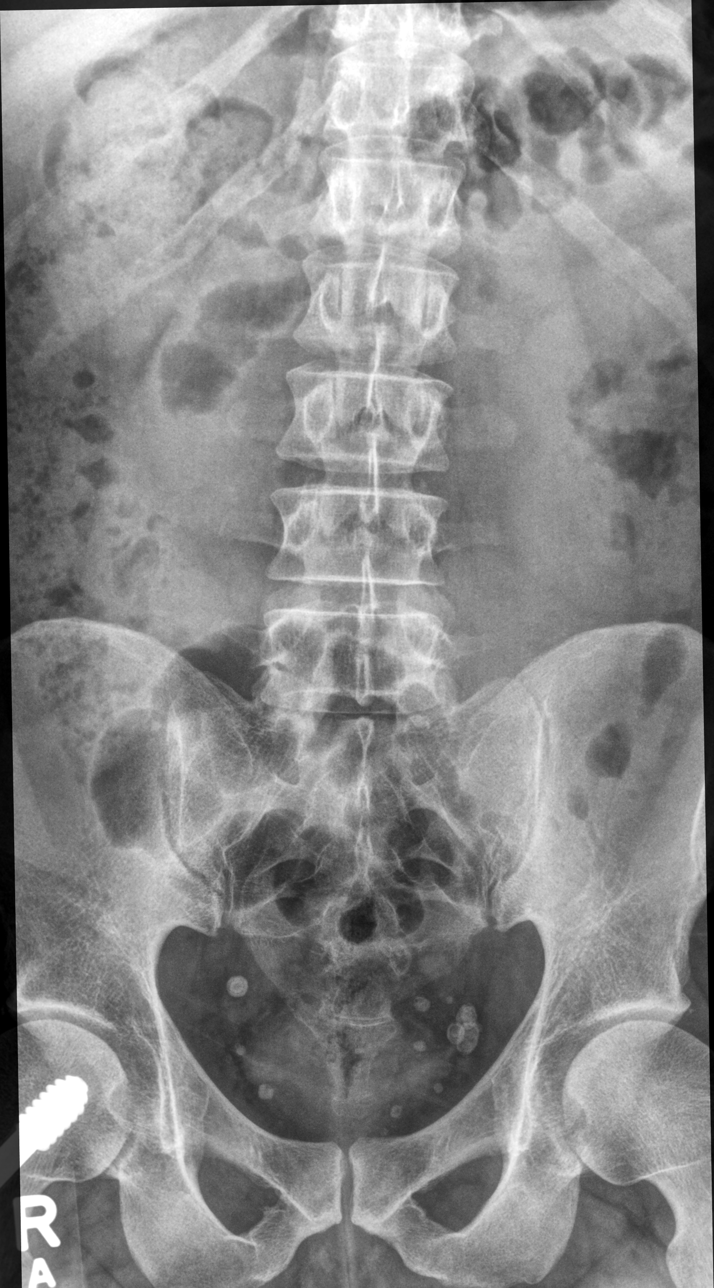

[lumbar spine oblique (1 of 2)]
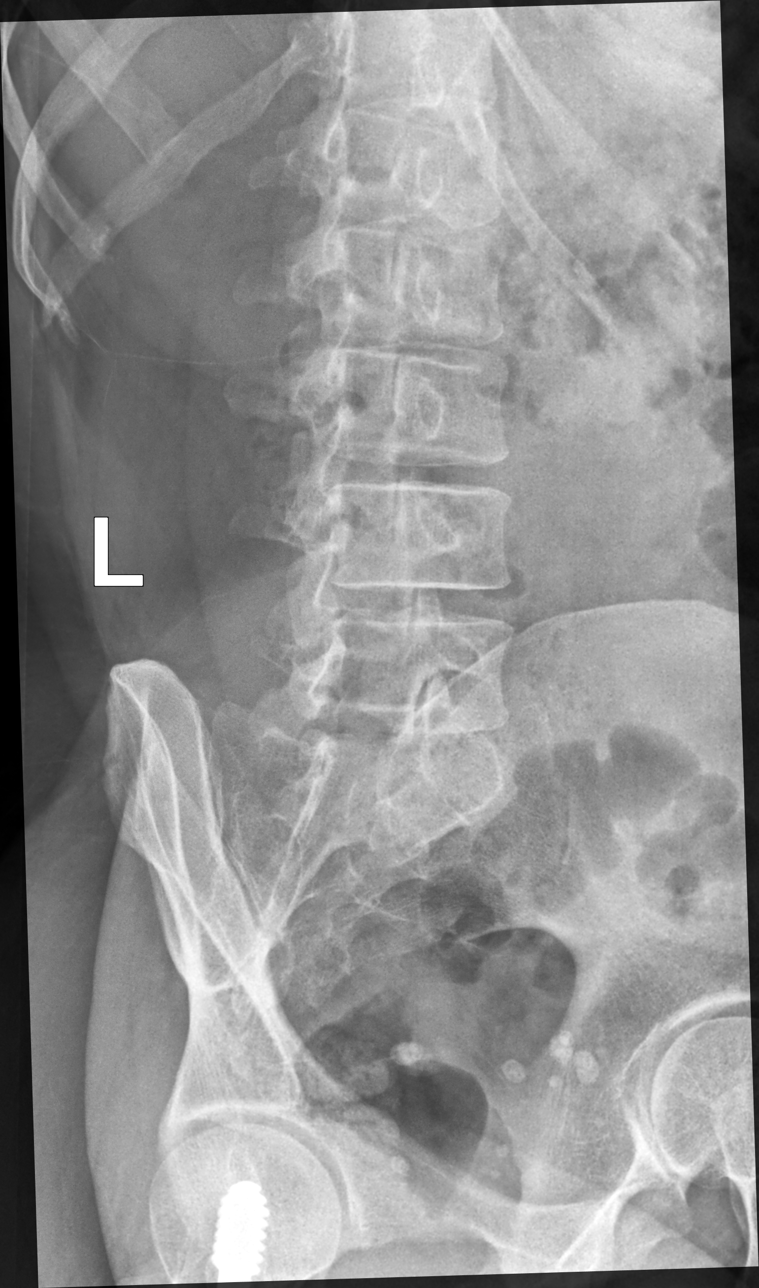

[lumbar spine oblique (2 of 2)]
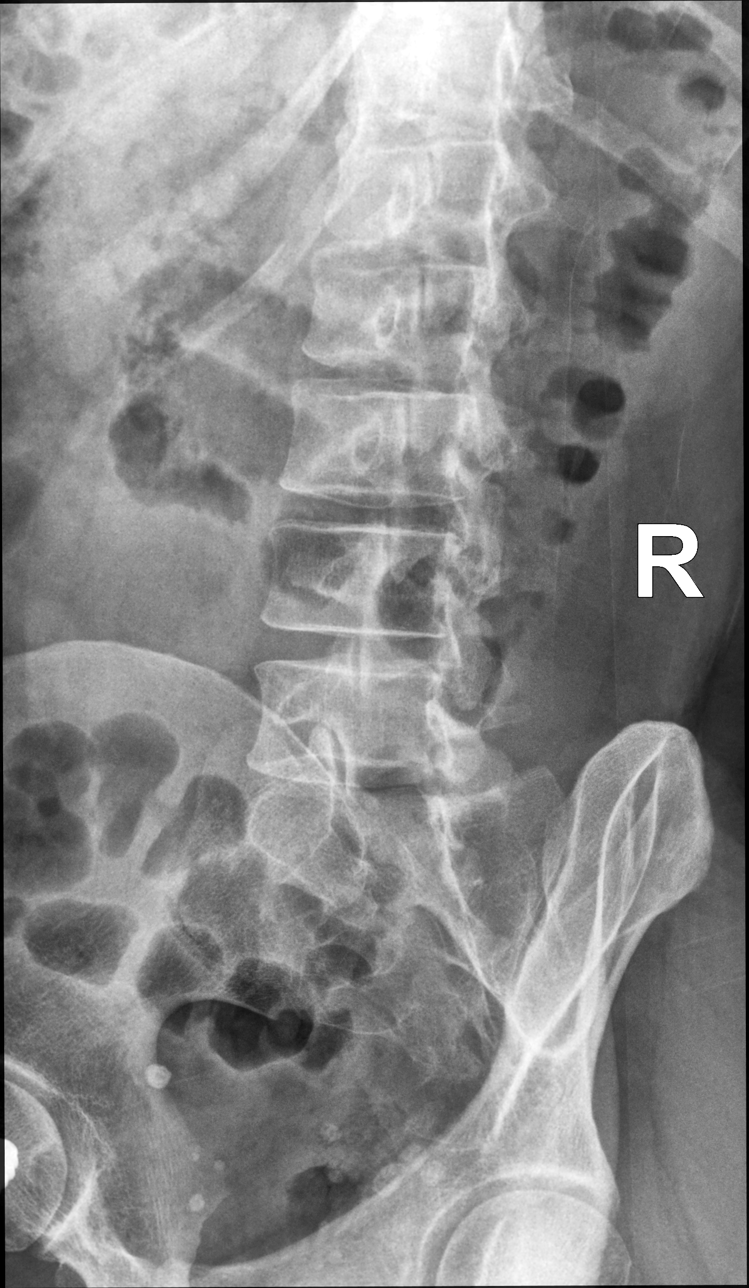

[lumbar spine lat (1 of 2)]
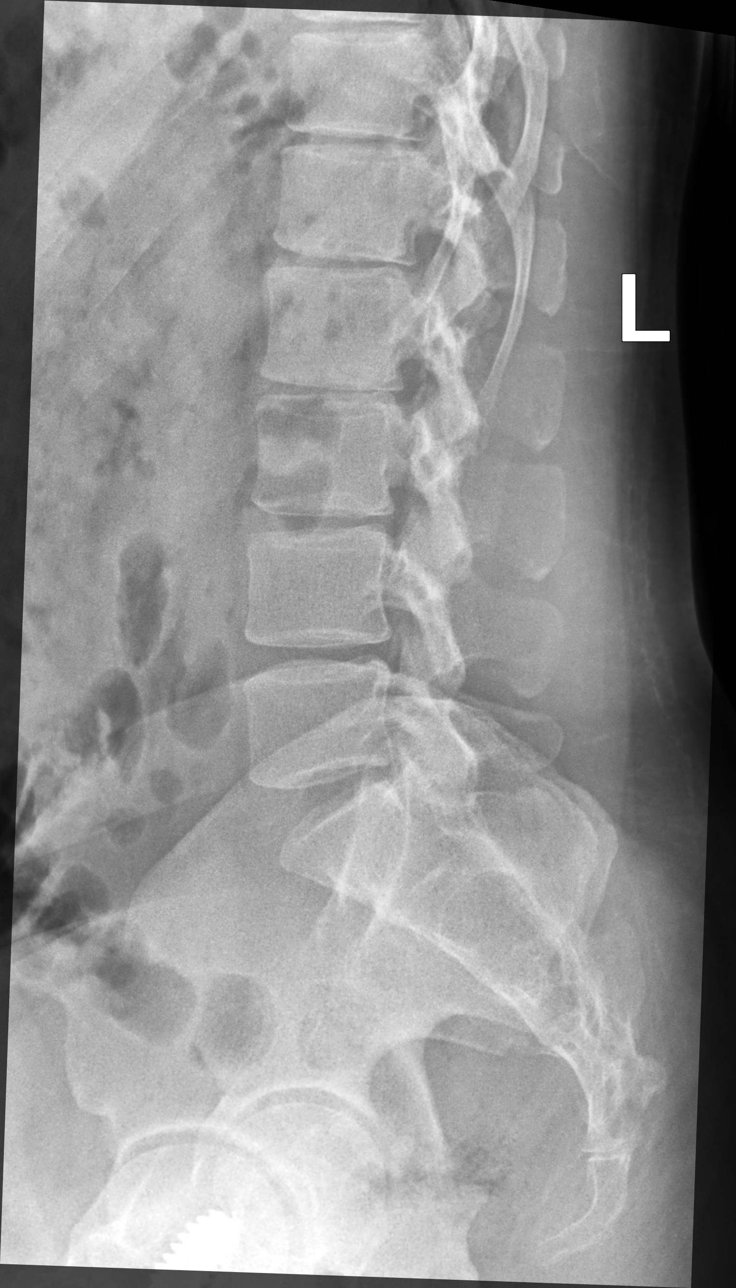

[lumbar spine lat (2 of 2)]
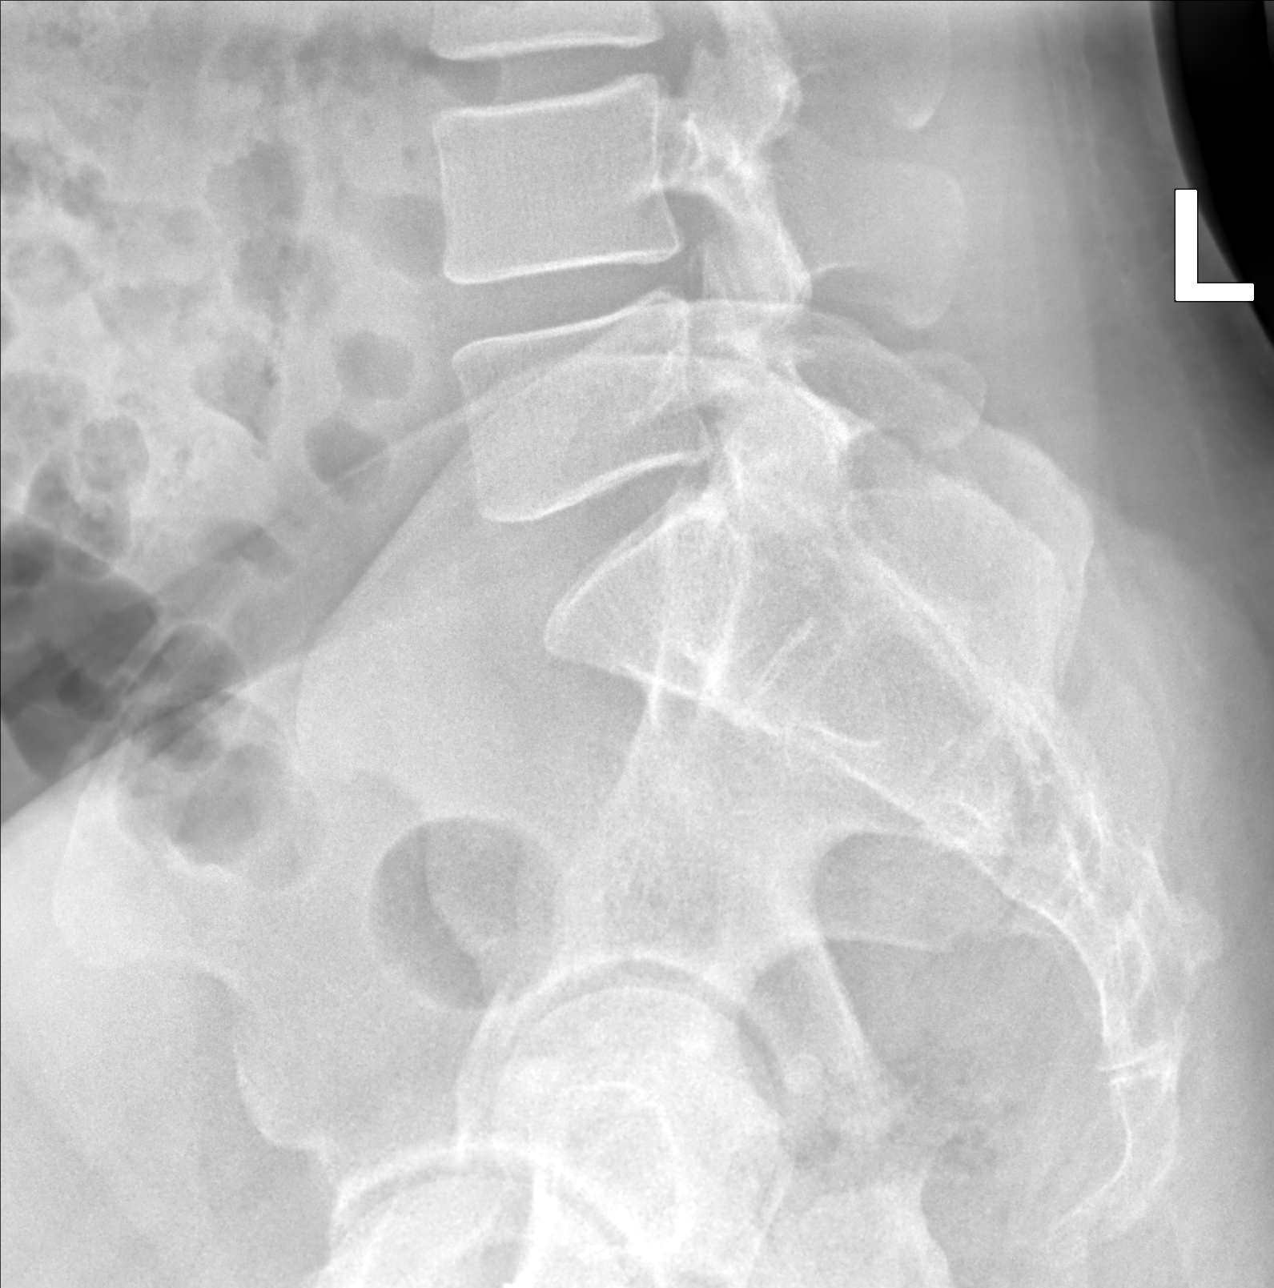

[5 of 5 positions shown; findings below may reference images not displayed]

FINDINGS: There are 5 non rib-bearing lumbar type vertebrae. There is chronic
straightening of the normal lumbar lordosis without significant
listhesis. No fracture is identified. Mild multilevel disc space
narrowing, most notable at L1-2 and L2-3, is similar to the prior
study. Multiple small calcifications in the pelvis are similar to
the prior study and likely represent phleboliths. A right hip screw
is partially visualized.
IMPRESSION: Mild lumbar disc degeneration without evidence of acute osseous
abnormality.

## 2020-12-15 ENCOUNTER — Telehealth: Payer: BC Managed Care – PPO | Admitting: Family Medicine

## 2020-12-16 ENCOUNTER — Other Ambulatory Visit: Payer: Self-pay

## 2020-12-16 ENCOUNTER — Ambulatory Visit: Payer: BC Managed Care – PPO | Admitting: Family Medicine

## 2020-12-16 ENCOUNTER — Encounter: Payer: Self-pay | Admitting: Family Medicine

## 2020-12-16 VITALS — BP 128/78 | HR 68 | Temp 98.9°F | Wt 209.6 lb

## 2020-12-16 DIAGNOSIS — Z8616 Personal history of COVID-19: Secondary | ICD-10-CM | POA: Diagnosis not present

## 2020-12-16 DIAGNOSIS — I10 Essential (primary) hypertension: Secondary | ICD-10-CM | POA: Diagnosis not present

## 2020-12-16 MED ORDER — AMLODIPINE BESYLATE 5 MG PO TABS: ORAL_TABLET | ORAL | 1 refills | Status: DC

## 2020-12-16 NOTE — Progress Notes (Signed)
Subjective:    Patient ID: Joseph Burns, male    DOB: 12/20/1990, 30 y.o.   MRN: [id number]  No chief complaint on file.   HPI Patient was seen today for follow-up.  Patient states he was diagnosed with COVID 2 weeks ago.  Symptoms included chills and headache which is since resolved.  Patient was unvaccinated against COVID.  Pt just wanted to make sure he was okay.  Pt states BP has been well controlled on Norvasc 5 mg.  Patient denies LE edema, changes in vision, CP. History reviewed. No pertinent past medical history.  No Known Allergies  ROS General: Denies fever, chills, night sweats, changes in weight, changes in appetite HEENT: Denies headaches, ear pain, changes in vision, rhinorrhea, sore throat CV: Denies CP, palpitations, SOB, orthopnea Pulm: Denies SOB, cough, wheezing GI: Denies abdominal pain, nausea, vomiting, diarrhea, constipation GU: Denies dysuria, hematuria, frequency Msk: Denies muscle cramps, joint pains Neuro: Denies weakness, numbness, tingling Skin: Denies rashes, bruising Psych: Denies depression, anxiety, hallucinations     Objective:    Blood pressure 128/78, pulse 68, temperature 98.9 F (37.2 C), temperature source Oral, weight 209 lb 9.6 oz (95.1 kg), SpO2 98 %.   Gen. Pleasant, well-nourished, in no distress, normal affect   HEENT: [place]/AT, face symmetric, conjunctiva clear, no scleral icterus, PERRLA, EOMI, nares patent without drainage, pharynx without erythema or exudate. Lungs: no accessory muscle use, CTAB, no wheezes or rales Cardiovascular: RRR, no m/r/g, no peripheral edema Musculoskeletal: No deformities, no cyanosis or clubbing, normal tone Neuro:  A&Ox3, CN II-XII intact, normal gait Skin:  Warm, no lesions/ rash   Wt Readings from Last 3 Encounters:  12/16/20 209 lb 9.6 oz (95.1 kg)  10/10/20 219 lb 3.2 oz (99.4 kg)  09/07/20 211 lb 6.4 oz (95.9 kg)    Lab Results  Component Value Date   WBC 6.7 09/07/2020   HGB 14.9  09/07/2020   HCT 45.6 09/07/2020   PLT 194 09/07/2020   GLUCOSE 107 (H) 09/07/2020   CHOL 174 09/07/2020   TRIG 69 09/07/2020   HDL 57 09/07/2020   LDLCALC 101 (H) 09/07/2020   ALT 31 09/07/2020   AST 23 09/07/2020   NA 138 09/07/2020   K 4.1 09/07/2020   CL 104 09/07/2020   CREATININE 0.88 09/07/2020   BUN 12 09/07/2020   CO2 26 09/07/2020   TSH 0.96 09/07/2020   HGBA1C 4.7 09/07/2020   MICROALBUR 0.6 09/07/2020    Assessment/Plan:  Essential hypertension -Controlled -Continue lifestyle modifications -Continue Norvasc 5 mg daily -Patient encouraged to check BP and keep a log to bring with him to clinic - Plan: amLODipine (NORVASC) 5 MG tablet  Personal history of COVID-19 virus infection -Resolved -Positive test result 2 weeks ago. -Patient okay to return to work.  Given note.  F/u as needed  Abbe Amsterdam, MD

## 2021-01-12 ENCOUNTER — Other Ambulatory Visit: Payer: Self-pay

## 2021-01-12 ENCOUNTER — Encounter: Payer: Self-pay | Admitting: Family Medicine

## 2021-01-12 ENCOUNTER — Ambulatory Visit: Payer: BC Managed Care – PPO | Admitting: Family Medicine

## 2021-01-12 VITALS — BP 126/80 | HR 83 | Temp 98.5°F | Wt 213.8 lb

## 2021-01-12 DIAGNOSIS — L720 Epidermal cyst: Secondary | ICD-10-CM | POA: Diagnosis not present

## 2021-01-12 DIAGNOSIS — I10 Essential (primary) hypertension: Secondary | ICD-10-CM | POA: Diagnosis not present

## 2021-01-12 NOTE — Progress Notes (Signed)
Subjective:    Patient ID: Joseph Burns, male    DOB: 1991/05/25, 30 y.o.   MRN: [id number]  No chief complaint on file.   HPI Patient was seen today for follow-up on HTN.  Patient taking Norvasc 5 mg daily.  Checking bp occasionally, typically 120s over 80s.  Patient interested in coming off medication if possible.  No family history of HTN.  Patient exercising less frequently than planned.  Patient notes bilateral neck pain/soreness.  Getting massages which helps some.  Thought pain 2/2 having his neck at an angle when his infant daughter is sleeping on his chest.  Getting a chair at work to see if this makes a difference.  Patient also notes a bump behind right ear.  Area is not painful.  No past medical history on file.  No Known Allergies  ROS General: Denies fever, chills, night sweats, changes in weight, changes in appetite HEENT: Denies headaches, ear pain, changes in vision, rhinorrhea, sore throat CV: Denies CP, palpitations, SOB, orthopnea Pulm: Denies SOB, cough, wheezing GI: Denies abdominal pain, nausea, vomiting, diarrhea, constipation GU: Denies dysuria, hematuria, frequency, vaginal discharge Msk: Denies muscle cramps, joint pains +neck pain Neuro: Denies weakness, numbness, tingling Skin: Denies rashes, bruising   +bump behind R ear Psych: Denies depression, anxiety, hallucinations    Objective:    Blood pressure 126/80, pulse 83, temperature 98.5 F (36.9 C), temperature source Oral, weight 213 lb 12.8 oz (97 kg), SpO2 97 %.  Gen. Pleasant, well-nourished, in no distress, normal affect  HEENT: [place]/AT, face symmetric, conjunctiva clear, no scleral icterus, PERRLA, EOMI, nares patent without drainage Lungs: no accessory muscle use Cardiovascular: RRR, no peripheral edema Musculoskeletal: No deformities, no cyanosis or clubbing, normal tone Neuro:  A&Ox3, CN II-XII intact, normal gait Skin:  Warm, no lesions/ rash.  Cyst on posterior R ear   Wt Readings from  Last 3 Encounters:  12/16/20 209 lb 9.6 oz (95.1 kg)  10/10/20 219 lb 3.2 oz (99.4 kg)  09/07/20 211 lb 6.4 oz (95.9 kg)    Lab Results  Component Value Date   WBC 6.7 09/07/2020   HGB 14.9 09/07/2020   HCT 45.6 09/07/2020   PLT 194 09/07/2020   GLUCOSE 107 (H) 09/07/2020   CHOL 174 09/07/2020   TRIG 69 09/07/2020   HDL 57 09/07/2020   LDLCALC 101 (H) 09/07/2020   ALT 31 09/07/2020   AST 23 09/07/2020   NA 138 09/07/2020   K 4.1 09/07/2020   CL 104 09/07/2020   CREATININE 0.88 09/07/2020   BUN 12 09/07/2020   CO2 26 09/07/2020   TSH 0.96 09/07/2020   HGBA1C 4.7 09/07/2020   MICROALBUR 0.6 09/07/2020    Assessment/Plan:  Essential hypertension -controlled -continue norvasc 5 mg -continue lifestyle modifications  Epidermoid cyst of ear -discussed options for removal vs continuing to monitor  F/u prn  Abbe Amsterdam, MD

## 2021-01-12 NOTE — Patient Instructions (Signed)
Epidermoid Cyst  An epidermoid cyst, also called an epidermal cyst, is a small lump under your skin. The cyst contains a substance called keratin. Do not try to pop or open the cyst yourself. What are the causes?  A blocked hair follicle.  A hair that curls and re-enters the skin instead of growing straight out of the skin.  A blocked pore.  Irritated skin.  An injury to the skin.  Certain conditions that are passed along from parent to child.  Human papillomavirus (HPV). This happens rarely when cysts occur on the bottom of the feet.  Long-term sun damage to the skin. What increases the risk?  Having acne.  Being male.  Having an injury to the skin.  Being past puberty.  Having certain conditions caused by genes (genetic disorder) What are the signs or symptoms? These cysts are usually harmless, but they can get infected. Symptoms of infection may include:  Redness.  Inflammation.  Tenderness.  Warmth.  Fever.  A bad-smelling substance that drains from the cyst.  Pus that drains from the cyst. How is this treated? In many cases, epidermoid cysts go away on their own without treatment. If a cyst becomes infected, treatment may include:  Opening and draining the cyst, done by a doctor. After draining, you may need minor surgery to remove the rest of the cyst.  Antibiotic medicine.  Shots of medicines (steroids) that help to reduce inflammation.  Surgery to remove the cyst. Surgery may be done if the cyst: ? Becomes large. ? Bothers you. ? Has a chance of turning into cancer.  Do not try to open a cyst yourself. Follow these instructions at home: Medicines  Take over-the-counter and prescription medicines as told by your doctor.  If you were prescribed an antibiotic medicine, take it as told by your doctor. Do not stop taking it even if you start to feel better. General instructions  Keep the area around your cyst clean and dry.  Wear loose, dry  clothing.  Avoid touching your cyst.  Check your cyst every day for signs of infection. Check for: ? Redness, swelling, or pain. ? Fluid or blood. ? Warmth. ? Pus or a bad smell.  Keep all follow-up visits. How is this prevented?  Wear clean, dry, clothing.  Avoid wearing tight clothing.  Keep your skin clean and dry. Take showers or baths every day. Contact a doctor if:  Your cyst has symptoms of infection.  Your condition does not improve or gets worse.  You have a cyst that looks different from other cysts you have had.  You have a fever. Get help right away if:  Redness spreads from the cyst into the area close by. Summary  An epidermoid cyst is a small lump under your skin.  If a cyst becomes infected, treatment may include surgery to open and drain the cyst, or to remove it.  Take over-the-counter and prescription medicines only as told by your doctor.  Contact a doctor if your condition is not improving or is getting worse.  Keep all follow-up visits. This information is not intended to replace advice given to you by your health care provider. Make sure you discuss any questions you have with your health care provider. Document Revised: 02/17/2020 Document Reviewed: 02/17/2020 Elsevier Patient Education  2021 Else[employer] Inc.  

## 2021-03-20 ENCOUNTER — Other Ambulatory Visit: Payer: Self-pay | Admitting: Family Medicine

## 2021-03-20 DIAGNOSIS — I10 Essential (primary) hypertension: Secondary | ICD-10-CM

## 2021-06-12 ENCOUNTER — Ambulatory Visit (INDEPENDENT_AMBULATORY_CARE_PROVIDER_SITE_OTHER): Payer: BC Managed Care – PPO | Admitting: Family Medicine

## 2021-06-12 ENCOUNTER — Encounter: Payer: Self-pay | Admitting: Family Medicine

## 2021-06-12 ENCOUNTER — Other Ambulatory Visit: Payer: Self-pay

## 2021-06-12 VITALS — BP 126/86 | HR 65 | Temp 97.9°F | Wt 218.8 lb

## 2021-06-12 DIAGNOSIS — Z8616 Personal history of COVID-19: Secondary | ICD-10-CM | POA: Diagnosis not present

## 2021-06-12 DIAGNOSIS — I10 Essential (primary) hypertension: Secondary | ICD-10-CM | POA: Diagnosis not present

## 2021-06-12 MED ORDER — AMLODIPINE BESYLATE 5 MG PO TABS: ORAL_TABLET | ORAL | 1 refills | Status: DC

## 2021-06-12 NOTE — Progress Notes (Signed)
Subjective:    Patient ID: Joseph Burns, male    DOB: 1991/02/06, 30 y.o.   MRN: [id number]  Chief Complaint  Patient presents with   Follow-up  Pt accompanied by his baby daughter.  HPI Patient was seen today for f/u on HTN and on acute illness.  Pt notes he tested positive for COVID-19 2 wks ago.  His only symptom was back pain and a mild HA when it first started.  Pt feeling better.  Needs note to return to work.  Pt's fiance was also sick, but his baby daughter was not sick.  Pt states bp has been elevated at home.  Not sure if cuff is working correctly.  Eating more fast food the last few wks since being sick.  Pt did not have any vaccinations against COVID-19.  Prior h/o COVID infection on 12/06/2020.  No past medical history on file.  No Known Allergies  ROS General: Denies fever, chills, night sweats, changes in weight, changes in appetite HEENT: Denies headaches, ear pain, changes in vision, rhinorrhea, sore throat CV: Denies CP, palpitations, SOB, orthopnea Pulm: Denies SOB, cough, wheezing GI: Denies abdominal pain, nausea, vomiting, diarrhea, constipation GU: Denies dysuria, hematuria, frequency Msk: Denies muscle cramps, joint pains Neuro: Denies weakness, numbness, tingling Skin: Denies rashes, bruising Psych: Denies depression, anxiety, hallucinations    Objective:    Blood pressure 126/86, pulse 65, temperature 97.9 F (36.6 C), temperature source Oral, weight 218 lb 12.8 oz (99.2 kg), SpO2 99 %.  Gen. Pleasant, well-nourished, in no distress, normal affect   HEENT: [place]/AT, face symmetric, conjunctiva clear, no scleral icterus, PERRLA, EOMI, nares patent without drainage Lungs: no accessory muscle use, CTAB, no wheezes or rales Cardiovascular: RRR, no m/r/g, no peripheral edema Musculoskeletal: No deformities, no cyanosis or clubbing, normal tone Neuro:  A&Ox3, CN II-XII intact, normal gait Skin:  Warm, no lesions/ rash  Wt Readings from Last 3 Encounters:   06/12/21 218 lb 12.8 oz (99.2 kg)  01/12/21 213 lb 12.8 oz (97 kg)  12/16/20 209 lb 9.6 oz (95.1 kg)    Lab Results  Component Value Date   WBC 6.7 09/07/2020   HGB 14.9 09/07/2020   HCT 45.6 09/07/2020   PLT 194 09/07/2020   GLUCOSE 107 (H) 09/07/2020   CHOL 174 09/07/2020   TRIG 69 09/07/2020   HDL 57 09/07/2020   LDLCALC 101 (H) 09/07/2020   ALT 31 09/07/2020   AST 23 09/07/2020   NA 138 09/07/2020   K 4.1 09/07/2020   CL 104 09/07/2020   CREATININE 0.88 09/07/2020   BUN 12 09/07/2020   CO2 26 09/07/2020   TSH 0.96 09/07/2020   HGBA1C 4.7 09/07/2020   MICROALBUR 0.6 09/07/2020    Assessment/Plan:  Essential hypertension  -discussed lifestyle modifications -continue norvasc 5 mg.  For continued elevation increase to 10 mg dialy - Plan: amLODipine (NORVASC) 5 MG tablet  History of 2019 novel coronavirus disease (COVID-19) -resolved -consider Covid vaccines in 90 days. -note given for work.  F/u in 1 month for HTN  Abbe Amsterdam, MD

## 2021-07-12 ENCOUNTER — Other Ambulatory Visit: Payer: Self-pay

## 2021-07-13 ENCOUNTER — Encounter: Payer: Self-pay | Admitting: Family Medicine

## 2021-07-13 ENCOUNTER — Ambulatory Visit: Payer: BC Managed Care – PPO | Admitting: Family Medicine

## 2021-07-13 VITALS — BP 128/81 | HR 81 | Temp 98.6°F | Wt 223.6 lb

## 2021-07-13 DIAGNOSIS — Z6835 Body mass index (BMI) 35.0-35.9, adult: Secondary | ICD-10-CM

## 2021-07-13 DIAGNOSIS — I10 Essential (primary) hypertension: Secondary | ICD-10-CM | POA: Diagnosis not present

## 2021-07-13 DIAGNOSIS — R0789 Other chest pain: Secondary | ICD-10-CM | POA: Diagnosis not present

## 2021-07-13 NOTE — Progress Notes (Signed)
Subjective:    Patient ID: Joseph Burns, male    DOB: Jun 08, 1991, 30 y.o.   MRN: [id number]  Chief Complaint  Patient presents with   Follow-up    BP    HPI Patient was seen today for f/u on HTN.  Pt states bp has been normal at home.  Forgot cuff.  Notes some stress at work and home.  Pt's daughter currently has a fever.  Pt trying to lose weight and get back to exercising more frequently.  Notes eating/convenience foods recently.  Denies headaches, changes in vision, CP, SOB, anxiety.  Notes occasional sensation in L chest that is hard to describe.  At times feels better with stretching chest muscles.  No past medical history on file.  No Known Allergies  ROS General: Denies fever, chills, night sweats, changes in weight, changes in appetite HEENT: Denies headaches, ear pain, changes in vision, rhinorrhea, sore throat CV: Denies CP, palpitations, SOB, orthopnea Pulm: Denies SOB, cough, wheezing GI: Denies abdominal pain, nausea, vomiting, diarrhea, constipation GU: Denies dysuria, hematuria, frequency Msk: Denies muscle cramps, joint pains  + intermittent abnormal chest sensation Neuro: Denies weakness, numbness, tingling Skin: Denies rashes, bruising Psych: Denies depression, anxiety, hallucinations + stress     Objective:    Blood pressure 128/81, pulse 81, temperature 98.6 F (37 C), temperature source Oral, weight 223 lb 9.6 oz (101.4 kg), SpO2 98 %.  Gen. Pleasant, well-nourished, in no distress, normal affect   HEENT: [place]/AT, face symmetric, conjunctiva clear, no scleral icterus, PERRLA, EOMI, nares patent without drainage Lungs: no accessory muscle use Cardiovascular: RRR, no peripheral edema Musculoskeletal: No deformities, no cyanosis or clubbing, normal tone Neuro:  A&Ox3, CN II-XII intact, normal gait Skin:  Warm, no lesions/ rash  PHQ9 SCORE ONLY 07/13/2021 10/07/2018 02/28/2017  PHQ-9 Total Score 5 0 0   GAD 7 : Generalized Anxiety Score 07/13/2021  Nervous,  Anxious, on Edge 1  Control/stop worrying 1  Worry too much - different things 1  Trouble relaxing 2  Restless 0  Easily annoyed or irritable 2  Afraid - awful might happen 0  Total GAD 7 Score 7  Anxiety Difficulty Somewhat difficult     Wt Readings from Last 3 Encounters:  07/13/21 223 lb 9.6 oz (101.4 kg)  06/12/21 218 lb 12.8 oz (99.2 kg)  01/12/21 213 lb 12.8 oz (97 kg)    Lab Results  Component Value Date   WBC 6.7 09/07/2020   HGB 14.9 09/07/2020   HCT 45.6 09/07/2020   PLT 194 09/07/2020   GLUCOSE 107 (H) 09/07/2020   CHOL 174 09/07/2020   TRIG 69 09/07/2020   HDL 57 09/07/2020   LDLCALC 101 (H) 09/07/2020   ALT 31 09/07/2020   AST 23 09/07/2020   NA 138 09/07/2020   K 4.1 09/07/2020   CL 104 09/07/2020   CREATININE 0.88 09/07/2020   BUN 12 09/07/2020   CO2 26 09/07/2020   TSH 0.96 09/07/2020   HGBA1C 4.7 09/07/2020   MICROALBUR 0.6 09/07/2020    Assessment/Plan:  Essential hypertension -Elevated -Initially 138/96, recheck 128/81 -Continue lifestyle modifications -Continue Norvasc 5 mg  Chest wall discomfort -Possibly associated with stress versus MSK.  Less likely CV -GAD-7 score 7 -PHQ-9 score 5 -Lipid panel largely normal 09/07/2020 -Continue exercising, stretching, and other stress reduction techniques -EKG 2019 normal.  Does not currently having symptoms EKG likely low yield. -Consider Holter monitor or continued symptoms -Continue to monitor  Class II severe obesity due to excess calories  with serious comorbidity, and body mass index (BMI) of 35.0-35.9 in adult [facility]) -Continue lifestyle modifications  F/u 3-4 months, sooner if needed  Abbe Amsterdam, MD

## 2021-08-08 DIAGNOSIS — Z113 Encounter for screening for infections with a predominantly sexual mode of transmission: Secondary | ICD-10-CM | POA: Diagnosis not present

## 2021-08-08 DIAGNOSIS — Z114 Encounter for screening for human immunodeficiency virus [HIV]: Secondary | ICD-10-CM | POA: Diagnosis not present

## 2021-09-14 ENCOUNTER — Encounter: Payer: Self-pay | Admitting: Family Medicine

## 2021-09-14 ENCOUNTER — Ambulatory Visit (INDEPENDENT_AMBULATORY_CARE_PROVIDER_SITE_OTHER): Payer: BC Managed Care – PPO | Admitting: Family Medicine

## 2021-09-14 ENCOUNTER — Other Ambulatory Visit: Payer: Self-pay

## 2021-09-14 ENCOUNTER — Other Ambulatory Visit: Payer: BC Managed Care – PPO

## 2021-09-14 VITALS — BP 124/86 | HR 65 | Temp 98.3°F | Ht 66.0 in | Wt 227.2 lb

## 2021-09-14 DIAGNOSIS — Z0001 Encounter for general adult medical examination with abnormal findings: Secondary | ICD-10-CM

## 2021-09-14 DIAGNOSIS — Z0000 Encounter for general adult medical examination without abnormal findings: Secondary | ICD-10-CM | POA: Diagnosis not present

## 2021-09-14 DIAGNOSIS — R635 Abnormal weight gain: Secondary | ICD-10-CM

## 2021-09-14 DIAGNOSIS — E7841 Elevated Lipoprotein(a): Secondary | ICD-10-CM

## 2021-09-14 DIAGNOSIS — M791 Myalgia, unspecified site: Secondary | ICD-10-CM

## 2021-09-14 DIAGNOSIS — I10 Essential (primary) hypertension: Secondary | ICD-10-CM | POA: Diagnosis not present

## 2021-09-14 LAB — VITAMIN D 25 HYDROXY (VIT D DEFICIENCY, FRACTURES): VITD: 15.17 ng/mL — ABNORMAL LOW (ref 30.00–100.00)

## 2021-09-14 LAB — CBC WITH DIFFERENTIAL/PLATELET
Basophils Absolute: 0.1 10*3/uL (ref 0.0–0.1)
Basophils Relative: 0.8 % (ref 0.0–3.0)
Eosinophils Absolute: 0.3 10*3/uL (ref 0.0–0.7)
Eosinophils Relative: 4.8 % (ref 0.0–5.0)
HCT: 44.5 % (ref 39.0–52.0)
Hemoglobin: 14.5 g/dL (ref 13.0–17.0)
Lymphocytes Relative: 23.6 % (ref 12.0–46.0)
Lymphs Abs: 1.5 10*3/uL (ref 0.7–4.0)
MCHC: 32.5 g/dL (ref 30.0–36.0)
MCV: 81.6 fl (ref 78.0–100.0)
Monocytes Absolute: 0.7 10*3/uL (ref 0.1–1.0)
Monocytes Relative: 12 % (ref 3.0–12.0)
Neutro Abs: 3.7 10*3/uL (ref 1.4–7.7)
Neutrophils Relative %: 58.8 % (ref 43.0–77.0)
Platelets: 190 10*3/uL (ref 150.0–400.0)
RBC: 5.45 Mil/uL (ref 4.22–5.81)
RDW: 14.4 % (ref 11.5–15.5)
WBC: 6.2 10*3/uL (ref 4.0–10.5)

## 2021-09-14 LAB — LIPID PANEL
Cholesterol: 155 mg/dL (ref 0–200)
HDL: 47.8 mg/dL (ref >39.00)
LDL Cholesterol: 93 mg/dL (ref 0–99)
NonHDL: 107.21
Total CHOL/HDL Ratio: 3
Triglycerides: 69 mg/dL (ref 0.0–149.0)
VLDL: 13.8 mg/dL (ref 0.0–40.0)

## 2021-09-14 LAB — BASIC METABOLIC PANEL
BUN: 13 mg/dL (ref 6–23)
CO2: 27 mEq/L (ref 19–32)
Calcium: 9.4 mg/dL (ref 8.4–10.5)
Chloride: 102 mEq/L (ref 96–112)
Creatinine, Ser: 1.04 mg/dL (ref 0.40–1.50)
GFR: 95.2 mL/min (ref >60.00)
Glucose, Bld: 108 mg/dL — ABNORMAL HIGH (ref 70–99)
Potassium: 4 mEq/L (ref 3.5–5.1)
Sodium: 137 mEq/L (ref 135–145)

## 2021-09-14 LAB — FOLATE: Folate: 8.6 ng/mL

## 2021-09-14 LAB — VITAMIN B12: Vitamin B-12: 415 pg/mL (ref 211–911)

## 2021-09-14 LAB — TSH: TSH: 0.97 u[IU]/mL (ref 0.35–5.50)

## 2021-09-14 LAB — T4, FREE: Free T4: 0.95 ng/dL (ref 0.60–1.60)

## 2021-09-14 MED ORDER — AMLODIPINE BESYLATE 5 MG PO TABS: ORAL_TABLET | ORAL | 3 refills | Status: DC

## 2021-09-14 NOTE — Progress Notes (Signed)
Subjective:     Joseph Burns is a 30 y.o. male and is here for a comprehensive physical exam. The patient reports doing well.  Still having the occasional L upper chest discomfort.  May last a few sec.  Notes improvement in BP on Norvasc 5 mg daily.  Request refill on medication.  Denies headaches, LE edema, changes in vision.  Pt with intermittent low back pain, not currently causing issues.  Patient trying to lose weight.  We will make changes, lose a few pounds, then regain it.  Patient trying not to eat as late but notes sometimes it is difficult with the schedule and his young daughter.  Patient notes mom and brother have thyroid issues.  Social History   Socioeconomic History   Marital status: Single    Spouse name: Not on file   Number of children: Not on file   Years of education: Not on file   Highest education level: Not on file  Occupational History   Not on file  Tobacco Use   Smoking status: Never   Smokeless tobacco: Never  Substance and Sexual Activity   Alcohol use: Yes    Comment: 6 cans of beer weekly on average   Drug use: Yes    Frequency: 2.0 times per week    Types: Marijuana   Sexual activity: Yes    Partners: Female  Other Topics Concern   Not on file  Social History Narrative   [employer] [employer]    Lives in [place]   Single   No children   Fun: movies, games, hanging out with friends   Social Determinants of Health   Financial Resource Strain: Not on file  Food Insecurity: Not on file  Transportation Needs: Not on file  Physical Activity: Not on file  Stress: Not on file  Social Connections: Not on file  Intimate Partner Violence: Not on file   Health Maintenance  Topic Date Due   Pneumococcal Vaccine 40-36 Years old (1 - PCV) Never done   OPHTHALMOLOGY EXAM  Never done   Hepatitis C Screening  Never done   HEMOGLOBIN A1C  03/08/2021   FOOT EXAM  09/07/2021   URINE MICROALBUMIN  09/07/2021   COVID-19 Vaccine (1) 03/15/2022 (Originally  12/26/1991)   TETANUS/TDAP  03/01/2027   HIV Screening  Completed   HPV VACCINES  Aged Out   INFLUENZA VACCINE  Discontinued    The following portions of the patient's history were reviewed and updated as appropriate: allergies, current medications, past family history, past medical history, past social history, past surgical history, and problem list.  Review of Systems Pertinent items noted in HPI and remainder of comprehensive ROS otherwise negative.   Objective:    BP 124/86 (BP Location: Right Arm, Patient Position: Sitting, Cuff Size: Large)   Pulse 65   Temp 98.3 F (36.8 C) (Oral)   Ht 5\' 6"  (1.676 m)   Wt 227 lb 3.2 oz (103.1 kg)   SpO2 97%   BMI 36.67 kg/m  General appearance: alert, cooperative, and no distress Head: Normocephalic, without obvious abnormality, atraumatic Eyes: conjunctivae/corneas clear. PERRL, EOM's intact. Fundi benign. Ears: normal TM's and external ear canals both ears Nose: Nares normal. Septum midline. Mucosa normal. No drainage or sinus tenderness. Throat: lips, mucosa, and tongue normal; teeth and gums normal Neck: no adenopathy, no carotid bruit, no JVD, supple, symmetrical, trachea midline, and thyroid not enlarged, symmetric, no tenderness/mass/nodules Back: symmetric, no curvature. ROM normal. No CVA tenderness. Lungs: clear  to auscultation bilaterally Heart: regular rate and rhythm, S1, S2 normal, no murmur, click, rub or gallop Abdomen: soft, non-tender; bowel sounds normal; no masses,  no organomegaly Extremities: extremities normal, atraumatic, no cyanosis or edema Pulses: 2+ and symmetric Skin: Skin color, texture, turgor normal. No rashes or lesions Lymph nodes: Cervical, supraclavicular, and axillary nodes normal. Neurologic: Alert and oriented X 3, normal strength and tone. Normal symmetric reflexes. Normal coordination and gait    Assessment:    Healthy male exam.      Plan:    Anticipatory guidance given including wearing  seatbelts, smoke detectors in the home, increasing physical activity, increasing p.o. intake of water and vegetables. -Labs -Discussed immunizations.  Patient declined influenza vaccine. -Next CPE in 1 year See After Visit Summary for Counseling Recommendations   Essential hypertension -Controlled -Continue Norvasc 5 mg daily -Continue lifestyle modification -Continue checking BP at home and keep a log to review at appointment  - Plan: Basic metabolic panel, amLODipine (NORVASC) 5 MG tablet  Weight gain -Discussed modifications -We will obtain labs - Plan: CBC with Differential/Platelet, TSH, T4, Free, Hemoglobin A1c, Vitamin D, 25-hydroxy  Elevated lipoprotein(a) -Lifestyle modifications -LDL minimally elevated at 101 on 09/07/2020 - Plan: Lipid panel  Myalgia  -We will obtain labs to further evaluate intermittent MSK chest discomfort possibly 2/2 muscle spasm, electrolyte abnormality, costochondritis, anxiety -PHQ-9 score 2, GAD-7 score 3 -Continue stretching and other supportive care -Continue to monitor - Plan: TSH, T4, Free, Basic metabolic panel, Vitamin B12, Folate  Follow-up prn.  Abbe Amsterdam, MD

## 2021-09-15 LAB — HEMOGLOBIN A1C
Hgb A1c MFr Bld: 4.6 % of total Hgb (ref <5.7)
Mean Plasma Glucose: 85 mg/dL
eAG (mmol/L): 4.7 mmol/L

## 2021-09-29 ENCOUNTER — Other Ambulatory Visit: Payer: Self-pay | Admitting: Family Medicine

## 2021-09-29 DIAGNOSIS — E559 Vitamin D deficiency, unspecified: Secondary | ICD-10-CM

## 2021-09-29 MED ORDER — VITAMIN D (ERGOCALCIFEROL) 1.25 MG (50000 UNIT) PO CAPS: 50000.0000 [IU] | ORAL_CAPSULE | ORAL | 0 refills | Status: DC

## 2021-10-18 DIAGNOSIS — Z113 Encounter for screening for infections with a predominantly sexual mode of transmission: Secondary | ICD-10-CM | POA: Diagnosis not present

## 2021-12-19 ENCOUNTER — Other Ambulatory Visit: Payer: Self-pay | Admitting: Family Medicine

## 2021-12-19 DIAGNOSIS — E559 Vitamin D deficiency, unspecified: Secondary | ICD-10-CM

## 2021-12-20 ENCOUNTER — Encounter: Payer: Self-pay | Admitting: Family Medicine

## 2021-12-20 ENCOUNTER — Ambulatory Visit: Payer: BC Managed Care – PPO | Admitting: Family Medicine

## 2021-12-20 ENCOUNTER — Ambulatory Visit (INDEPENDENT_AMBULATORY_CARE_PROVIDER_SITE_OTHER): Payer: BC Managed Care – PPO | Admitting: Family Medicine

## 2021-12-20 VITALS — BP 116/64 | HR 80 | Temp 98.2°F | Resp 18 | Ht 66.0 in | Wt 228.0 lb

## 2021-12-20 DIAGNOSIS — I10 Essential (primary) hypertension: Secondary | ICD-10-CM | POA: Diagnosis not present

## 2021-12-20 DIAGNOSIS — M545 Low back pain, unspecified: Secondary | ICD-10-CM

## 2021-12-20 DIAGNOSIS — E559 Vitamin D deficiency, unspecified: Secondary | ICD-10-CM | POA: Diagnosis not present

## 2021-12-20 DIAGNOSIS — L989 Disorder of the skin and subcutaneous tissue, unspecified: Secondary | ICD-10-CM | POA: Diagnosis not present

## 2021-12-20 LAB — VITAMIN D 25 HYDROXY (VIT D DEFICIENCY, FRACTURES): VITD: 35.5 ng/mL (ref 30.00–100.00)

## 2021-12-20 NOTE — Progress Notes (Signed)
Subjective:    Patient ID: Joseph Burns, male    DOB: [birth year], 31 y.o.   MRN: [id number]  Chief Complaint  Patient presents with   Follow-up    HPI Patient was seen today for follow-up.  Patient states he has been doing well overall.  Not exercising as much or checking BP.  Taking Norvasc 5 mg daily.  Inquires if thyroid was checked at last visit as his mother has hypothyroidism and his brother brother has hyperthyroidism.  Patient inquires about ways to prevent back pain.  Previous x-rays reveal degenerative changes.  Patient notes continued midline back discomfort.  States does not sit all the way back-chair while at work.  Denies weakness in LEs.  Patient also inquires about refill on ergocalciferol 50,000 IUs.  Completed a 12-week course.  Pt inquires if needs new referral to dermatology.  Previously seen for bumps in the scalp, pruritic at times.  Currently using old spice shampoo.  Patient does not apply any oil or grease to the scalp.  No past medical history on file.  No Known Allergies  ROS General: Denies fever, chills, night sweats, changes in weight, changes in appetite HEENT: Denies headaches, ear pain, changes in vision, rhinorrhea, sore throat CV: Denies CP, palpitations, SOB, orthopnea Pulm: Denies SOB, cough, wheezing GI: Denies abdominal pain, nausea, vomiting, diarrhea, constipation GU: Denies dysuria, hematuria, frequency Msk: Denies muscle cramps, joint pains +intermittent midline low back pain Neuro: Denies weakness, numbness, tingling Skin: Denies rashes, bruising  +bumps of scalp Psych: Denies depression, anxiety, hallucinations     Objective:    Blood pressure 116/64, pulse 80, temperature 98.2 F (36.8 C), resp. rate 18, height 5\' 6"  (1.676 m), weight 228 lb (103.4 kg), SpO2 100 %.  Gen. Pleasant, well-nourished, in no distress, normal affect   HEENT: [place]/AT, face symmetric, conjunctiva clear, no scleral icterus, PERRLA, EOMI, nares patent without  drainage Lungs: no accessory muscle use Cardiovascular: RRR, no peripheral edema Musculoskeletal: No deformities, no cyanosis or clubbing, normal tone Neuro:  A&Ox3, CN II-XII intact, normal gait Skin:  Warm, no lesions/ rash  Wt Readings from Last 3 Encounters:  12/20/21 228 lb (103.4 kg)  09/14/21 227 lb 3.2 oz (103.1 kg)  07/13/21 223 lb 9.6 oz (101.4 kg)    Lab Results  Component Value Date   WBC 6.2 09/14/2021   HGB 14.5 09/14/2021   HCT 44.5 09/14/2021   PLT 190.0 09/14/2021   GLUCOSE 108 (H) 09/14/2021   CHOL 155 09/14/2021   TRIG 69.0 09/14/2021   HDL 47.80 09/14/2021   LDLCALC 93 09/14/2021   ALT 31 09/07/2020   AST 23 09/07/2020   NA 137 09/14/2021   K 4.0 09/14/2021   CL 102 09/14/2021   CREATININE 1.04 09/14/2021   BUN 13 09/14/2021   CO2 27 09/14/2021   TSH 0.97 09/14/2021   HGBA1C 4.6 09/14/2021   MICROALBUR 0.6 09/07/2020    Assessment/Plan:  Essential hypertension -Controlled -Continue Norvasc 5 mg daily -Continue lifestyle modifications  Vitamin D deficiency  -Completed 12-week course of ergocalciferol 50,000 IU -Encouraged to start over-the-counter vitamin D - Plan: Vitamin D, 25-hydroxy  Midline low back pain without sciatica, unspecified chronicity -X-ray lumbar spine 09/02/2019 with mild lumbar disc degeneration without evidence of acute osseous abnormality.  Right hip screw partially visualized -Discussed ergonomic modifications to workspace, working on posture, stretching exercises. -OTC topical analgesics, NSAIDs or Tylenol as needed, heat  Scalp lesion -Patient encouraged to contact dermatology for follow-up. -Consider switching shampoos and using  a gentle moisturizer on hair/scalp  Patient advised TSH and free T4 low normal on 09/14/2021  F/u as needed  [name], MD

## 2022-01-16 DIAGNOSIS — J029 Acute pharyngitis, unspecified: Secondary | ICD-10-CM | POA: Diagnosis not present

## 2022-01-16 DIAGNOSIS — H6693 Otitis media, unspecified, bilateral: Secondary | ICD-10-CM | POA: Diagnosis not present

## 2022-01-16 DIAGNOSIS — Z20822 Contact with and (suspected) exposure to covid-19: Secondary | ICD-10-CM | POA: Diagnosis not present

## 2022-01-16 DIAGNOSIS — Z87891 Personal history of nicotine dependence: Secondary | ICD-10-CM | POA: Diagnosis not present

## 2022-05-11 ENCOUNTER — Ambulatory Visit: Payer: BC Managed Care – PPO | Admitting: Family Medicine

## 2022-05-16 ENCOUNTER — Encounter: Payer: Self-pay | Admitting: Family Medicine

## 2022-05-16 ENCOUNTER — Ambulatory Visit: Payer: BC Managed Care – PPO | Admitting: Family Medicine

## 2022-05-16 ENCOUNTER — Telehealth: Payer: Self-pay | Admitting: Family Medicine

## 2022-05-16 VITALS — BP 120/78 | HR 57 | Temp 98.4°F | Ht 66.0 in | Wt 218.2 lb

## 2022-05-16 DIAGNOSIS — Z8639 Personal history of other endocrine, nutritional and metabolic disease: Secondary | ICD-10-CM

## 2022-05-16 DIAGNOSIS — Z789 Other specified health status: Secondary | ICD-10-CM | POA: Diagnosis not present

## 2022-05-16 DIAGNOSIS — L600 Ingrowing nail: Secondary | ICD-10-CM | POA: Diagnosis not present

## 2022-05-16 DIAGNOSIS — I10 Essential (primary) hypertension: Secondary | ICD-10-CM | POA: Diagnosis not present

## 2022-05-16 MED ORDER — MULTI-VITAMIN/MINERALS PO TABS: 1.0000 | ORAL_TABLET | Freq: Every day | ORAL | 3 refills | Status: DC

## 2022-05-16 NOTE — Telephone Encounter (Signed)
Spoke to patient. Patient was given printed Rx to be given to pharmacy during OV. Made it aware to patient on the phone.

## 2022-05-16 NOTE — Progress Notes (Signed)
Subjective:    Patient ID: Joseph Burns, male    DOB: 1991/06/11, 31 y.o.   MRN: [id number]  No chief complaint on file.   HPI Patient was seen today for f/u on HTN.  States bp has been good.  Taking norvasc 5 mg daily.  Denies headaches, CP, changes in vision, LE edema.  Recently started taking beet extract to see if it would help with BP.  Working on increasing p.o. intake of water.  Cut down on soda intake, may have a Sprite.    Pt inquires about options for vitamins so that he can use his health savings account to cover cost.  Patient considering seeing a chiropractor for low back pain.  Patient endorses increasing pain of left great toenail especially dog steps on his toe or if something presses on his toenail.  The discomfort typically occurs along the medial side of toenail.  Inquires about what can be done for the discomfort.    Pt's daughter is now 35 yo.  No past medical history on file.  No Known Allergies  ROS General: Denies fever, chills, night sweats, changes in weight, changes in appetite HEENT: Denies headaches, ear pain, changes in vision, rhinorrhea, sore throat CV: Denies CP, palpitations, SOB, orthopnea Pulm: Denies SOB, cough, wheezing GI: Denies abdominal pain, nausea, vomiting, diarrhea, constipation GU: Denies dysuria, hematuria, frequency Msk: Denies muscle cramps, joint pains +intermittent low back pain. Neuro: Denies weakness, numbness, tingling Skin: Denies rashes, bruising +L great toe nail pain. Psych: Denies depression, anxiety, hallucinations     Objective:    Blood pressure 120/78, pulse (!) 57, temperature 98.4 F (36.9 C), temperature source Oral, height 5\' 6"  (1.676 m), weight 218 lb 3.2 oz (99 kg), SpO2 98 %.  Gen. Pleasant, well-nourished, in no distress, normal affect   HEENT: [place]/AT, face symmetric, conjunctiva clear, no scleral icterus, PERRLA, EOMI, nares patent without drainage Lungs: no accessory muscle use, CTAB, no wheezes or  rales Cardiovascular: RRR, no m/r/g, no peripheral edema Musculoskeletal: No deformities, no cyanosis or clubbing, normal tone Neuro:  A&Ox3, CN II-XII intact, normal gait Skin:  Warm, no lesions/ rash.  L great toe nail peaked in center with discomfort of medial nail edge when pressure applied to top of nail.  No erythema or drainage noted.   Wt Readings from Last 3 Encounters:  05/16/22 218 lb 3.2 oz (99 kg)  12/20/21 228 lb (103.4 kg)  09/14/21 227 lb 3.2 oz (103.1 kg)    Lab Results  Component Value Date   WBC 6.2 09/14/2021   HGB 14.5 09/14/2021   HCT 44.5 09/14/2021   PLT 190.0 09/14/2021   GLUCOSE 108 (H) 09/14/2021   CHOL 155 09/14/2021   TRIG 69.0 09/14/2021   HDL 47.80 09/14/2021   LDLCALC 93 09/14/2021   ALT 31 09/07/2020   AST 23 09/07/2020   NA 137 09/14/2021   K 4.0 09/14/2021   CL 102 09/14/2021   CREATININE 1.04 09/14/2021   BUN 13 09/14/2021   CO2 27 09/14/2021   TSH 0.97 09/14/2021   HGBA1C 4.6 09/14/2021   MICROALBUR 0.6 09/07/2020    Assessment/Plan:  Essential hypertension -Controlled -Continue Norvasc 5 mg daily -Continue lifestyle modifications -Continue monitoring BP regularly.  Ingrown left big toenail  -Discussed supportive care including wearing shoes with enough room in toebox, soaking toe in Epsom salt, using dental floss to relieve pressure at angle of nail. -Discussed ingrown toenail removal. -Given handout - Plan: Ambulatory referral to Podiatry  Patient requests  alternate treatment -History of vitamin D deficiency -Rx for MVI give so pt can use HSA funds. - Plan: Multiple Vitamins-Minerals (MULTIVITAMIN WITH MINERALS) tablet  History of vitamin D deficiency -Plan: Multiple vitamins-minerals tablet  F/u as needed in 4-6 months, sooner if needed  Abbe Amsterdam, MD

## 2022-05-16 NOTE — Telephone Encounter (Signed)
Pt just left his appt today and is at the pharmacy It is 11:27 am and PT states Rx should've been sent by now. Re:  Multiple Vitamins-Minerals (MULTIVITAMIN WITH MINERALS) tablet  [id number] - [place], [place] - [address] AT [facility] [address] & [facility] [address] Phone:  [phone]  Fax:  [phone]

## 2022-06-28 ENCOUNTER — Ambulatory Visit: Payer: BC Managed Care – PPO | Admitting: Podiatry

## 2022-06-28 ENCOUNTER — Ambulatory Visit (INDEPENDENT_AMBULATORY_CARE_PROVIDER_SITE_OTHER): Payer: BC Managed Care – PPO

## 2022-06-28 ENCOUNTER — Encounter: Payer: Self-pay | Admitting: Podiatry

## 2022-06-28 DIAGNOSIS — M79672 Pain in left foot: Secondary | ICD-10-CM

## 2022-06-28 DIAGNOSIS — L608 Other nail disorders: Secondary | ICD-10-CM | POA: Diagnosis not present

## 2022-06-28 DIAGNOSIS — M7752 Other enthesopathy of left foot: Secondary | ICD-10-CM | POA: Diagnosis not present

## 2022-06-28 DIAGNOSIS — L600 Ingrowing nail: Secondary | ICD-10-CM

## 2022-06-28 DIAGNOSIS — L603 Nail dystrophy: Secondary | ICD-10-CM | POA: Diagnosis not present

## 2022-06-28 DIAGNOSIS — S90221A Contusion of right lesser toe(s) with damage to nail, initial encounter: Secondary | ICD-10-CM | POA: Diagnosis not present

## 2022-06-28 NOTE — Progress Notes (Signed)
Subjective: Chief Complaint  Patient presents with   Ingrown Toenail    Pt came in today for a left ingrown toenail, stated 5 years ago, pt has some pain    31 year old male presents with above complaints.  He states the ingrown toenail for the last 5 years and does get discomfort worse after the toenail check to see if actually ingrown toenail there is anything else going on with it.  He does get tender with pressure.  Currently denies any drainage or pus.  I did see him back in 2021 for the same issue.  Objective: AAO x3, NAD DP/PT pulses palpable bilaterally, CRT less than 3 seconds Nails are hypertrophic with yellow discoloration.  No erythema to the toenail sites.  There is dried blood at the base of the right second toenail the nail is firmly here.  There is no incarceration present left hallux medial nail border worse than lateral.  Trace edema there is no erythema or warmth.  There is no drainage or pus.  No signs of abscess or infection today. No pain with calf compression, swelling, warmth, erythema  Assessment: Ingrown toenail, subungual hematoma  Plan: -All treatment options discussed with the patient including all alternatives, risks, complications.  -X-rays were obtained and reviewed with the patient.  3 views of the left foot were obtained.  There is mild spurring present distal phalanx dorsally. -Discussed with him different treatment options for the ingrown toenail.  After discussion he wants to proceed with routine debridement.  I started him in the corner and complications of bleeding.  If symptoms continue or worsen will need 2 weeks partial nail avulsion.  We discussed total nail removal as well as exostectomy as well.  He is now to proceed with surgery at this time. -Monitor the right second toenail this appears to be subungual hematoma but there is any extension any worsening or no resolution consider biopsy. -Nails debrided sent for culture, pathology to [facility] for  evaluation of onychomycosis -Patient encouraged to call the office with any questions, concerns, change in symptoms.   35 minutes were spent with the patient.  Greater than 50% was in face-to-face contact discussing treatment options and reviewing x-rays.  Vivi Barrack DPM

## 2022-07-12 DIAGNOSIS — R197 Diarrhea, unspecified: Secondary | ICD-10-CM | POA: Diagnosis not present

## 2022-07-18 ENCOUNTER — Other Ambulatory Visit: Payer: Self-pay | Admitting: Podiatry

## 2022-07-18 DIAGNOSIS — M7752 Other enthesopathy of left foot: Secondary | ICD-10-CM

## 2022-08-09 ENCOUNTER — Encounter: Payer: Self-pay | Admitting: Podiatry

## 2022-08-13 ENCOUNTER — Other Ambulatory Visit: Payer: Self-pay

## 2022-08-13 ENCOUNTER — Ambulatory Visit: Payer: BC Managed Care – PPO | Admitting: Family Medicine

## 2022-08-13 ENCOUNTER — Encounter: Payer: Self-pay | Admitting: Family Medicine

## 2022-08-13 VITALS — BP 124/98 | HR 88 | Temp 98.5°F | Wt 219.2 lb

## 2022-08-13 DIAGNOSIS — L720 Epidermal cyst: Secondary | ICD-10-CM | POA: Diagnosis not present

## 2022-08-13 DIAGNOSIS — Z8639 Personal history of other endocrine, nutritional and metabolic disease: Secondary | ICD-10-CM

## 2022-08-13 DIAGNOSIS — W19XXXA Unspecified fall, initial encounter: Secondary | ICD-10-CM

## 2022-08-13 DIAGNOSIS — L729 Follicular cyst of the skin and subcutaneous tissue, unspecified: Secondary | ICD-10-CM | POA: Diagnosis not present

## 2022-08-13 DIAGNOSIS — S39012A Strain of muscle, fascia and tendon of lower back, initial encounter: Secondary | ICD-10-CM | POA: Diagnosis not present

## 2022-08-13 DIAGNOSIS — Z789 Other specified health status: Secondary | ICD-10-CM

## 2022-08-13 MED ORDER — MULTI-VITAMIN/MINERALS PO TABS: 1.0000 | ORAL_TABLET | Freq: Every day | ORAL | 3 refills | Status: AC

## 2022-08-13 NOTE — Patient Instructions (Addendum)
Your last physical was September 14, 2021.  You can schedule the next one at your convenience.

## 2022-08-13 NOTE — Progress Notes (Signed)
Subjective:    Patient ID: Joseph Burns, male    DOB: [birth year], 31 y.o.   MRN: [id number]  Chief Complaint  Patient presents with   Mass    Bumps around the forehead,going on for a while, was seeing a derm specialist, but they no longer have an office.  Cyst on chest, started as a flat bump about 4-5 months ago. Squeezed it and a little pus came out, then it got bigger and has not gone down.     HPI Patient was seen today for ongoing concerns.  Patient endorses being seen by dermatology but their office is closed.  Patient would like a new referral for bumps in scalp and pimple-like bump on chest.  Patient endorses squeezing area on chest but it came back larger.  Patient also mentions tripping over his dog 3 weeks ago and landing in a twisted position.  Patient endorses right-sided low back pain.  Took ibuprofen and says improvement in symptoms.  Intermittent discomfort with certain movements such as rolling over at night.  Patient feels like symptoms should be resolved by now.  Patient also concerned about history of "arthritis" noted on an x-ray.  Wonders if continued symptoms could be due to it.  Patient doing more stretching at the gym since injury.  Patient inquires about scheduling physical.  Patient also inquires about health maintenance items for DM.  Had an elevated A1c of 6.5% once.  Since then A1c has been normal, under 5%.  No past medical history on file.  No Known Allergies  ROS General: Denies fever, chills, night sweats, changes in weight, changes in appetite HEENT: Denies headaches, ear pain, changes in vision, rhinorrhea, sore throat CV: Denies CP, palpitations, SOB, orthopnea Pulm: Denies SOB, cough, wheezing GI: Denies abdominal pain, nausea, vomiting, diarrhea, constipation GU: Denies dysuria, hematuria, frequency Msk: Denies muscle cramps, joint pains + right-sided low back pain. Neuro: Denies weakness, numbness, tingling Skin: Denies rashes, bruising + bumps on  scalp, bone, chest Psych: Denies depression, anxiety, hallucinations     Objective:    Blood pressure (!) 124/98, pulse 88, temperature 98.5 F (36.9 C), temperature source Oral, weight 219 lb 3.2 oz (99.4 kg), SpO2 96 %.  Gen. Pleasant, well-nourished, in no distress, normal affect   HEENT: [place]/AT, face symmetric, conjunctiva clear, no scleral icterus, PERRLA, EOMI, nares patent without drainage Lungs: no accessory muscle use Cardiovascular: RRR, no peripheral edema Musculoskeletal: No TTP of cervical, thoracic, lumbar spine midline.  Mild TTP of right lumbar paraspinal muscles.  Mild discomfort with flexion of spine.  No discomfort with extension or lateral movement of spine.  No deformities, no cyanosis or clubbing, normal tone Neuro:  A&Ox3, CN II-XII intact, normal gait Skin:  Warm, dry, intact.  Fluctuant cyst on midline upper chest with central opening adjacent to hair follicle.  Several hyperpigmented flattened cystlike structures along hair line of frontal area of scalp and occipital area.   Wt Readings from Last 3 Encounters:  08/13/22 219 lb 3.2 oz (99.4 kg)  05/16/22 218 lb 3.2 oz (99 kg)  12/20/21 228 lb (103.4 kg)    Lab Results  Component Value Date   WBC 6.2 09/14/2021   HGB 14.5 09/14/2021   HCT 44.5 09/14/2021   PLT 190.0 09/14/2021   GLUCOSE 108 (H) 09/14/2021   CHOL 155 09/14/2021   TRIG 69.0 09/14/2021   HDL 47.80 09/14/2021   LDLCALC 93 09/14/2021   ALT 31 09/07/2020   AST 23 09/07/2020   NA 137  09/14/2021   K 4.0 09/14/2021   CL 102 09/14/2021   CREATININE 1.04 09/14/2021   BUN 13 09/14/2021   CO2 27 09/14/2021   TSH 0.97 09/14/2021   HGBA1C 4.6 09/14/2021   MICROALBUR 0.6 09/07/2020    Assessment/Plan:  Cyst of skin  - Plan: Ambulatory referral to Dermatology  Epidermoid cyst of skin of scalp  - Plan: Ambulatory referral to Dermatology  Strain of lumbar paraspinous muscle, initial encounter -Improving -Discussed supportive care  including stretching, heat, massage, topical analgesics, Tylenol as needed. -Patient considering chiropractor -Discussed expected duration of symptoms 4-6 weeks.  Fall from standing, initial encounter -Fall prevention  F/u as needed.  Advised to schedule CPE on or after 09/14/2022.  [name], MD

## 2022-08-23 DIAGNOSIS — L738 Other specified follicular disorders: Secondary | ICD-10-CM | POA: Diagnosis not present

## 2022-08-23 DIAGNOSIS — L720 Epidermal cyst: Secondary | ICD-10-CM | POA: Diagnosis not present

## 2022-09-17 ENCOUNTER — Encounter: Payer: BC Managed Care – PPO | Admitting: Family Medicine

## 2022-09-19 DIAGNOSIS — M62830 Muscle spasm of back: Secondary | ICD-10-CM | POA: Diagnosis not present

## 2022-09-19 DIAGNOSIS — M545 Low back pain, unspecified: Secondary | ICD-10-CM | POA: Diagnosis not present

## 2022-09-19 DIAGNOSIS — M9901 Segmental and somatic dysfunction of cervical region: Secondary | ICD-10-CM | POA: Diagnosis not present

## 2022-09-19 DIAGNOSIS — M9903 Segmental and somatic dysfunction of lumbar region: Secondary | ICD-10-CM | POA: Diagnosis not present

## 2022-09-20 DIAGNOSIS — M545 Low back pain, unspecified: Secondary | ICD-10-CM | POA: Diagnosis not present

## 2022-09-20 DIAGNOSIS — M9903 Segmental and somatic dysfunction of lumbar region: Secondary | ICD-10-CM | POA: Diagnosis not present

## 2022-09-20 DIAGNOSIS — M9901 Segmental and somatic dysfunction of cervical region: Secondary | ICD-10-CM | POA: Diagnosis not present

## 2022-09-20 DIAGNOSIS — M62830 Muscle spasm of back: Secondary | ICD-10-CM | POA: Diagnosis not present

## 2022-09-27 DIAGNOSIS — M545 Low back pain, unspecified: Secondary | ICD-10-CM | POA: Diagnosis not present

## 2022-09-27 DIAGNOSIS — M9901 Segmental and somatic dysfunction of cervical region: Secondary | ICD-10-CM | POA: Diagnosis not present

## 2022-09-27 DIAGNOSIS — M9903 Segmental and somatic dysfunction of lumbar region: Secondary | ICD-10-CM | POA: Diagnosis not present

## 2022-09-27 DIAGNOSIS — M62830 Muscle spasm of back: Secondary | ICD-10-CM | POA: Diagnosis not present

## 2022-09-28 ENCOUNTER — Other Ambulatory Visit: Payer: Self-pay | Admitting: Family Medicine

## 2022-09-28 DIAGNOSIS — I10 Essential (primary) hypertension: Secondary | ICD-10-CM

## 2022-10-01 DIAGNOSIS — M62830 Muscle spasm of back: Secondary | ICD-10-CM | POA: Diagnosis not present

## 2022-10-01 DIAGNOSIS — M545 Low back pain, unspecified: Secondary | ICD-10-CM | POA: Diagnosis not present

## 2022-10-01 DIAGNOSIS — M9903 Segmental and somatic dysfunction of lumbar region: Secondary | ICD-10-CM | POA: Diagnosis not present

## 2022-10-01 DIAGNOSIS — M9901 Segmental and somatic dysfunction of cervical region: Secondary | ICD-10-CM | POA: Diagnosis not present

## 2022-10-03 DIAGNOSIS — M545 Low back pain, unspecified: Secondary | ICD-10-CM | POA: Diagnosis not present

## 2022-10-03 DIAGNOSIS — M62830 Muscle spasm of back: Secondary | ICD-10-CM | POA: Diagnosis not present

## 2022-10-03 DIAGNOSIS — M9903 Segmental and somatic dysfunction of lumbar region: Secondary | ICD-10-CM | POA: Diagnosis not present

## 2022-10-03 DIAGNOSIS — M9901 Segmental and somatic dysfunction of cervical region: Secondary | ICD-10-CM | POA: Diagnosis not present

## 2022-10-04 DIAGNOSIS — M9901 Segmental and somatic dysfunction of cervical region: Secondary | ICD-10-CM | POA: Diagnosis not present

## 2022-10-04 DIAGNOSIS — M545 Low back pain, unspecified: Secondary | ICD-10-CM | POA: Diagnosis not present

## 2022-10-04 DIAGNOSIS — M9903 Segmental and somatic dysfunction of lumbar region: Secondary | ICD-10-CM | POA: Diagnosis not present

## 2022-10-04 DIAGNOSIS — M62830 Muscle spasm of back: Secondary | ICD-10-CM | POA: Diagnosis not present

## 2022-11-25 ENCOUNTER — Encounter: Payer: Self-pay | Admitting: Podiatry

## 2022-12-10 NOTE — Telephone Encounter (Signed)
Can you please assist him? Thanks!

## 2022-12-13 ENCOUNTER — Telehealth: Payer: Self-pay | Admitting: Podiatry

## 2022-12-13 NOTE — Telephone Encounter (Signed)
Called pt to see if he could come in on 1.25 at 145pm and pt was not able to make that appt. I have kept him at 1.30 at 815 and added to the waitlist for any cancellations.

## 2022-12-25 ENCOUNTER — Ambulatory Visit: Payer: BC Managed Care – PPO | Admitting: Podiatry

## 2022-12-25 DIAGNOSIS — L600 Ingrowing nail: Secondary | ICD-10-CM

## 2022-12-25 MED ORDER — CEPHALEXIN 500 MG PO CAPS: 500.0000 mg | ORAL_CAPSULE | Freq: Three times a day (TID) | ORAL | 0 refills | Status: DC

## 2022-12-25 NOTE — Patient Instructions (Addendum)

## 2022-12-25 NOTE — Progress Notes (Signed)
Subjective: Chief Complaint  Patient presents with   Ingrown Toenail    Left foot, great hallux    32 year old male presents the above concerns.  He states the nails getting soreness proceed with having the corners of the nail removed.  Currently denies any drainage or pus.  Objective: AAO x3, NAD DP/PT pulses palpable bilaterally, CRT less than 3 seconds Left hallux nail is hypertrophic, dystrophic and discolored.  No hyperpigmentation.  There is incurvation present about the medial lateral nail borders with tenderness palpation.  Edema there is no ascending cellulitis.  No drainage or pus.  No proximal visual crepitation. No pain with calf compression, swelling, warmth, erythema  Assessment: Left hallux ingrown toenail  Plan: -All treatment options discussed with the patient including all alternatives, risks, complications.  -At this time, the patient is requesting partial nail removal with chemical matricectomy to the symptomatic portion of the nail. Risks and complications were discussed with the patient for which they understand and written consent was obtained. Under sterile conditions a total of 3 mL of a mixture of 2% lidocaine plain and 0.5% Marcaine plain was infiltrated in a hallux block fashion. Once anesthetized, the skin was prepped in sterile fashion. A tourniquet was then applied. Next the medial and lateral aspect of hallux nail border was then sharply excised making sure to remove the entire offending nail border. Once the nails were ensured to be removed area was debrided and the underlying skin was intact. There is no purulence identified in the procedure. Next phenol was then applied under standard conditions and copiously irrigated. Silvadene was applied. A dry sterile dressing was applied. After application of the dressing the tourniquet was removed and there is found to be an immediate capillary refill time to the digit. The patient tolerated the procedure well any  complications. Post procedure instructions were discussed the patient for which he verbally understood. Discussed signs/symptoms of infection and directed to call the office immediately should any occur or go directly to the emergency room. In the meantime, encouraged to call the office with any questions, concerns, changes symptoms. -Keflex -Patient encouraged to call the office with any questions, concerns, change in symptoms.   Trula Slade DPM

## 2023-01-10 ENCOUNTER — Other Ambulatory Visit: Payer: BC Managed Care – PPO

## 2023-01-10 ENCOUNTER — Ambulatory Visit (INDEPENDENT_AMBULATORY_CARE_PROVIDER_SITE_OTHER): Payer: BC Managed Care – PPO | Admitting: Family Medicine

## 2023-01-10 ENCOUNTER — Encounter: Payer: Self-pay | Admitting: Family Medicine

## 2023-01-10 VITALS — BP 132/90 | HR 70 | Temp 98.5°F | Ht 67.32 in | Wt 212.4 lb

## 2023-01-10 DIAGNOSIS — Z0189 Encounter for other specified special examinations: Secondary | ICD-10-CM

## 2023-01-10 DIAGNOSIS — Z113 Encounter for screening for infections with a predominantly sexual mode of transmission: Secondary | ICD-10-CM

## 2023-01-10 DIAGNOSIS — Z0000 Encounter for general adult medical examination without abnormal findings: Secondary | ICD-10-CM

## 2023-01-10 DIAGNOSIS — I10 Essential (primary) hypertension: Secondary | ICD-10-CM | POA: Diagnosis not present

## 2023-01-10 DIAGNOSIS — Z1159 Encounter for screening for other viral diseases: Secondary | ICD-10-CM

## 2023-01-10 DIAGNOSIS — K649 Unspecified hemorrhoids: Secondary | ICD-10-CM

## 2023-01-10 LAB — COMPREHENSIVE METABOLIC PANEL
ALT: 31 U/L (ref 0–53)
AST: 26 U/L (ref 0–37)
Albumin: 4.5 g/dL (ref 3.5–5.2)
Alkaline Phosphatase: 59 U/L (ref 39–117)
BUN: 10 mg/dL (ref 6–23)
CO2: 25 mEq/L (ref 19–32)
Calcium: 9.6 mg/dL (ref 8.4–10.5)
Chloride: 103 mEq/L (ref 96–112)
Creatinine, Ser: 0.95 mg/dL (ref 0.40–1.50)
GFR: 105.14 mL/min (ref >60.00)
Glucose, Bld: 91 mg/dL (ref 70–99)
Potassium: 4 mEq/L (ref 3.5–5.1)
Sodium: 138 mEq/L (ref 135–145)
Total Bilirubin: 0.5 mg/dL (ref 0.2–1.2)
Total Protein: 7.8 g/dL (ref 6.0–8.3)

## 2023-01-10 LAB — LIPID PANEL
Cholesterol: 151 mg/dL (ref 0–200)
HDL: 47.6 mg/dL (ref >39.00)
LDL Cholesterol: 88 mg/dL (ref 0–99)
NonHDL: 102.98
Total CHOL/HDL Ratio: 3
Triglycerides: 74 mg/dL (ref 0.0–149.0)
VLDL: 14.8 mg/dL (ref 0.0–40.0)

## 2023-01-10 LAB — CBC WITH DIFFERENTIAL/PLATELET
Basophils Absolute: 0.1 10*3/uL (ref 0.0–0.1)
Basophils Relative: 1.1 % (ref 0.0–3.0)
Eosinophils Absolute: 0.2 10*3/uL (ref 0.0–0.7)
Eosinophils Relative: 3.7 % (ref 0.0–5.0)
HCT: 48.9 % (ref 39.0–52.0)
Hemoglobin: 16.1 g/dL (ref 13.0–17.0)
Lymphocytes Relative: 23.9 % (ref 12.0–46.0)
Lymphs Abs: 1.3 10*3/uL (ref 0.7–4.0)
MCHC: 32.9 g/dL (ref 30.0–36.0)
MCV: 82.9 fl (ref 78.0–100.0)
Monocytes Absolute: 0.6 10*3/uL (ref 0.1–1.0)
Monocytes Relative: 11.8 % (ref 3.0–12.0)
Neutro Abs: 3.3 10*3/uL (ref 1.4–7.7)
Neutrophils Relative %: 59.5 % (ref 43.0–77.0)
Platelets: 172 10*3/uL (ref 150.0–400.0)
RBC: 5.9 Mil/uL — ABNORMAL HIGH (ref 4.22–5.81)
RDW: 14.2 % (ref 11.5–15.5)
WBC: 5.5 10*3/uL (ref 4.0–10.5)

## 2023-01-10 LAB — VITAMIN D 25 HYDROXY (VIT D DEFICIENCY, FRACTURES): VITD: 21.45 ng/mL — ABNORMAL LOW (ref 30.00–100.00)

## 2023-01-10 LAB — T4, FREE: Free T4: 0.92 ng/dL (ref 0.60–1.60)

## 2023-01-10 LAB — VITAMIN B12: Vitamin B-12: 615 pg/mL (ref 211–911)

## 2023-01-10 LAB — TSH: TSH: 0.72 u[IU]/mL (ref 0.35–5.50)

## 2023-01-10 MED ORDER — HYDROCORTISONE ACETATE 25 MG RE SUPP: 25.0000 mg | Freq: Two times a day (BID) | RECTAL | 0 refills | Status: DC

## 2023-01-10 NOTE — Addendum Note (Signed)
Addended by: [name] R on: 01/10/2023 10:14 AM   Modules accepted: Orders

## 2023-01-10 NOTE — Progress Notes (Addendum)
Established Patient Office Visit   Subjective  Patient ID: Joseph Burns, male    DOB: [birth year]  Age: 32 y.o. MRN: [id number]  Chief Complaint  Patient presents with   Annual Exam    Pt is a 32 yo male with pmh sig for HTN who was seen today for CPE.  Patient expresses concern for hemorrhoids with intermittent bleeding x 3 months.  Pt states at times he pushes them back in.  Noticed blood on toilet tissue.  Pt endorses straining at times with Bms and sitting on the toilet for long periods of time playing on his phone.  Pt denies constipation.  Patient has not tried OTC products.  Patient states he has not been checking BP.  Taking Norvasc 5 mg daily.  Continuing to make lifestyle changes.  Patient requesting vitamin B12 check as his gf's B12 was low.  Requesting STI testing.       ROS Negative unless stated above    Objective:     BP (!) 132/90 (BP Location: Left Arm, Patient Position: Sitting, Cuff Size: Large)   Pulse 70   Temp 98.5 F (36.9 C) (Oral)   Ht 5' 7.32" (1.71 m)   Wt 212 lb 6.4 oz (96.3 kg)   SpO2 99%   BMI 32.95 kg/m    Physical Exam Constitutional:      Appearance: Normal appearance.  HENT:     Head: Normocephalic and atraumatic.     Right Ear: Tympanic membrane, ear canal and external ear normal.     Left Ear: Tympanic membrane, ear canal and external ear normal.     Nose: Nose normal.     Mouth/Throat:     Mouth: Mucous membranes are moist.     Pharynx: No oropharyngeal exudate or posterior oropharyngeal erythema.  Eyes:     General: No scleral icterus.    Extraocular Movements: Extraocular movements intact.     Conjunctiva/sclera: Conjunctivae normal.     Pupils: Pupils are equal, round, and reactive to light.  Neck:     Thyroid: No thyromegaly.  Cardiovascular:     Rate and Rhythm: Normal rate and regular rhythm.     Pulses: Normal pulses.     Heart sounds: Normal heart sounds. No murmur heard.    No friction rub.  Pulmonary:      Effort: Pulmonary effort is normal.     Breath sounds: Normal breath sounds. No wheezing, rhonchi or rales.  Abdominal:     General: Bowel sounds are normal.     Palpations: Abdomen is soft.     Tenderness: There is no abdominal tenderness.  Musculoskeletal:        General: No deformity. Normal range of motion.  Lymphadenopathy:     Cervical: No cervical adenopathy.  Skin:    General: Skin is warm and dry.     Findings: No lesion.  Neurological:     General: No focal deficit present.     Mental Status: He is alert and oriented to person, place, and time.  Psychiatric:        Mood and Affect: Mood normal.        Thought Content: Thought content normal.      No results found for any visits on 01/10/23.    Assessment & Plan:  Well adult exam -Anticipatory guidance given including wearing seatbelts, smoke detectors in the home, increasing physical activity, increasing p.o. intake of water and vegetables. -labs -immunizations reviewed. Pt declines. -Given handout -Next CPE 1  year -     Hemoglobin A1c  Essential hypertension -Elevated -Continue Norvasc 5 mg daily. -Patient encouraged to start checking BP at home.  For consistent elevations greater than 140/90 increase medication versus adding additional agent for better diastolic control. -     TSH -     T4, free -     Lipid panel -     Comprehensive metabolic panel  Hemorrhoids, unspecified hemorrhoid type -Discussed OTC medications. -Increase fiber intake -Depository. -For continued symptoms GI -     CBC with Differential/Platelet -     Hydrocortisone Acetate; Place 1 suppository (25 mg total) rectally 2 (two) times daily.  Dispense: 12 suppository; Refill: 0  Patient request for diagnostic testing -Asymptomatic -     Hemoglobin A1c -     Vitamin B12 -     Vitamin D  Routine screening for STI (sexually transmitted infection) -     HIV Antibody (routine testing w rflx) -     RPR -     C. trachomatis/N.  gonorrhoeae RNA    Return in about 1 month (around 02/08/2023) for blood pressure re-check.   Billie Ruddy, MD

## 2023-01-11 LAB — C. TRACHOMATIS/N. GONORRHOEAE RNA
C. trachomatis RNA, TMA: NOT DETECTED
N. gonorrhoeae RNA, TMA: NOT DETECTED

## 2023-01-11 LAB — HEMOGLOBIN A1C
Hgb A1c MFr Bld: 4.8 % of total Hgb (ref <5.7)
Mean Plasma Glucose: 91 mg/dL
eAG (mmol/L): 5 mmol/L

## 2023-01-11 LAB — RPR: RPR Ser Ql: NONREACTIVE

## 2023-01-11 LAB — HEPATITIS C ANTIBODY: Hepatitis C Ab: NONREACTIVE

## 2023-01-11 LAB — HIV ANTIBODY (ROUTINE TESTING W REFLEX): HIV 1&2 Ab, 4th Generation: NONREACTIVE

## 2023-01-17 ENCOUNTER — Other Ambulatory Visit: Payer: Self-pay | Admitting: Family Medicine

## 2023-01-17 DIAGNOSIS — E559 Vitamin D deficiency, unspecified: Secondary | ICD-10-CM

## 2023-01-17 MED ORDER — VITAMIN D (ERGOCALCIFEROL) 1.25 MG (50000 UNIT) PO CAPS: 50000.0000 [IU] | ORAL_CAPSULE | ORAL | 0 refills | Status: DC

## 2023-01-22 ENCOUNTER — Telehealth: Payer: Self-pay

## 2023-01-22 NOTE — Telephone Encounter (Signed)
Attempt to submit PA for Anucort-HC '25mg'$  through telephone. Spoke to [place]. She states the medication is not under the formulary. He is not able to get the medication as pt has no coverage under his plan.

## 2023-02-08 ENCOUNTER — Encounter: Payer: Self-pay | Admitting: Family Medicine

## 2023-02-08 ENCOUNTER — Ambulatory Visit (INDEPENDENT_AMBULATORY_CARE_PROVIDER_SITE_OTHER): Payer: BC Managed Care – PPO | Admitting: Family Medicine

## 2023-02-08 VITALS — BP 130/86 | HR 69 | Temp 98.5°F | Ht 67.32 in | Wt 209.8 lb

## 2023-02-08 DIAGNOSIS — K649 Unspecified hemorrhoids: Secondary | ICD-10-CM

## 2023-02-08 DIAGNOSIS — E559 Vitamin D deficiency, unspecified: Secondary | ICD-10-CM

## 2023-02-08 DIAGNOSIS — I10 Essential (primary) hypertension: Secondary | ICD-10-CM

## 2023-02-08 NOTE — Progress Notes (Signed)
   Established Patient Office Visit   Subjective  Patient ID: Joseph Burns, male    DOB: [birth year]  Age: 32 y.o. MRN: [id number]  Chief Complaint  Patient presents with   Medical Management of Chronic Issues    Pt is a 32 yo male with pmh sig for HTN and vitamin D def seen for f/u.  Pt states he has been under increased stress as his fiance is having some health challenges.  Pt taking care of her and their daughter.  Has not been able to go the the gym in the last few wks and is eating more fast food.  Taking norvasc 5 mg daily.  Pt denies HA, CP, LE edema.  Noticed 2 swollen areas on lower back a few wks ago that have now resolved.  Denies injury.  Taking vitamin D weekly for vitamin D deficiency.  Unsure if notices a difference in energy.  Was unable to obtain hemorrhoid medication from pharmacy.      ROS Negative unless stated above    Objective:     BP 130/86 (BP Location: Left Arm, Patient Position: Sitting, Cuff Size: Large)   Pulse 69   Temp 98.5 F (36.9 C) (Oral)   Ht 5' 7.32" (1.71 m)   Wt 209 lb 12.8 oz (95.2 kg)   SpO2 98%   BMI 32.55 kg/m    Physical Exam Constitutional:      General: He is not in acute distress.    Appearance: Normal appearance.  HENT:     Head: Normocephalic and atraumatic.     Nose: Nose normal.     Mouth/Throat:     Mouth: Mucous membranes are moist.  Cardiovascular:     Rate and Rhythm: Normal rate.     Heart sounds: No murmur heard.    No gallop.  Pulmonary:     Effort: Pulmonary effort is normal. No respiratory distress.     Breath sounds: No wheezing, rhonchi or rales.  Skin:    General: Skin is warm and dry.  Neurological:     Mental Status: He is alert and oriented to person, place, and time.      No results found for any visits on 02/08/23.    Assessment & Plan:  Essential hypertension -controlled -continue norvasc 5 mg daily -lifestyle modifications -for continued elevation increase norvasc or add additional  med  Vitamin D deficiency -continue ergocalciferol 50,000 IUs weekly-start OTC vitamin D 1000-2000 IUs.  Hemorrhoids, unspecified hemorrhoid type -Per chart review PA denied by insurance company as medication is not covered by patient's plan.  Patient encouraged to contact insurance company regarding options.   Return if symptoms worsen or fail to improve.   Billie Ruddy, MD

## 2023-02-08 NOTE — Patient Instructions (Signed)
Per chart review the prior authorization for the medication was denied as your insurance plan does not cover it.  You can ask your insurance company what if anything they do cover for hemorrhoids or if they will appeal the denial.

## 2023-04-03 DIAGNOSIS — Z113 Encounter for screening for infections with a predominantly sexual mode of transmission: Secondary | ICD-10-CM | POA: Diagnosis not present

## 2023-04-03 DIAGNOSIS — A64 Unspecified sexually transmitted disease: Secondary | ICD-10-CM | POA: Diagnosis not present

## 2023-04-12 ENCOUNTER — Other Ambulatory Visit: Payer: Self-pay | Admitting: Family Medicine

## 2023-04-12 DIAGNOSIS — E559 Vitamin D deficiency, unspecified: Secondary | ICD-10-CM

## 2023-11-11 ENCOUNTER — Other Ambulatory Visit: Payer: Self-pay | Admitting: Family Medicine

## 2023-11-11 DIAGNOSIS — I10 Essential (primary) hypertension: Secondary | ICD-10-CM

## 2023-11-28 ENCOUNTER — Telehealth: Payer: Self-pay | Admitting: *Deleted

## 2023-11-28 NOTE — Telephone Encounter (Signed)
 Copied from CRM [id number]. Topic: Clinical - Medication Refill >> Nov 26, 2023 12:27 PM Rosina BIRCH wrote: Most Recent Primary Care Visit:  Provider: MERCER CLOTILDA SAUNDERS  Department: [facility]  Visit Type: OFFICE VISIT  Date: 02/08/2023  Medication: amLODipine   Has the patient contacted their pharmacy? No (Agent: If no, request that the patient contact the pharmacy for the refill. If patient does not wish to contact the pharmacy document the reason why and proceed with request.) (Agent: If yes, when and what did the pharmacy advise?)  Is this the correct pharmacy for this prescription? Yes If no, delete pharmacy and type the correct one.  This is the patient's preferred pharmacy:   [facility] [phone] - [place], [place] - [address] AT [facility] [address] [place] [address] Phone: [phone] Fax: [phone]   Has the prescription been filled recently? No  Is the patient out of the medication? No  Has the patient been seen for an appointment in the last year OR does the patient have an upcoming appointment? Yes  Can we respond through MyChart? Yes  Agent: Please be advised that Rx refills may take up to 3 business days. We ask that you follow-up with your pharmacy.

## 2024-01-13 ENCOUNTER — Encounter: Payer: BC Managed Care – PPO | Admitting: Family Medicine

## 2024-01-15 ENCOUNTER — Encounter: Payer: BC Managed Care – PPO | Admitting: Family Medicine

## 2024-01-24 ENCOUNTER — Ambulatory Visit (INDEPENDENT_AMBULATORY_CARE_PROVIDER_SITE_OTHER): Payer: BC Managed Care – PPO | Admitting: Family Medicine

## 2024-01-24 ENCOUNTER — Encounter: Payer: Self-pay | Admitting: Family Medicine

## 2024-01-24 ENCOUNTER — Other Ambulatory Visit: Payer: BC Managed Care – PPO

## 2024-01-24 VITALS — BP 134/100 | HR 84 | Temp 98.2°F | Ht 67.32 in | Wt 217.4 lb

## 2024-01-24 DIAGNOSIS — K649 Unspecified hemorrhoids: Secondary | ICD-10-CM | POA: Diagnosis not present

## 2024-01-24 DIAGNOSIS — R4589 Other symptoms and signs involving emotional state: Secondary | ICD-10-CM | POA: Diagnosis not present

## 2024-01-24 DIAGNOSIS — E66811 Obesity, class 1: Secondary | ICD-10-CM

## 2024-01-24 DIAGNOSIS — Z0000 Encounter for general adult medical examination without abnormal findings: Secondary | ICD-10-CM

## 2024-01-24 DIAGNOSIS — R0789 Other chest pain: Secondary | ICD-10-CM

## 2024-01-24 DIAGNOSIS — I10 Essential (primary) hypertension: Secondary | ICD-10-CM

## 2024-01-24 DIAGNOSIS — R12 Heartburn: Secondary | ICD-10-CM

## 2024-01-24 DIAGNOSIS — M7052 Other bursitis of knee, left knee: Secondary | ICD-10-CM | POA: Diagnosis not present

## 2024-01-24 DIAGNOSIS — E559 Vitamin D deficiency, unspecified: Secondary | ICD-10-CM | POA: Diagnosis not present

## 2024-01-24 DIAGNOSIS — Z113 Encounter for screening for infections with a predominantly sexual mode of transmission: Secondary | ICD-10-CM | POA: Diagnosis not present

## 2024-01-24 DIAGNOSIS — Z6833 Body mass index (BMI) 33.0-33.9, adult: Secondary | ICD-10-CM | POA: Diagnosis not present

## 2024-01-24 LAB — CBC WITH DIFFERENTIAL/PLATELET
Basophils Absolute: 0 10*3/uL (ref 0.0–0.1)
Basophils Relative: 0.7 % (ref 0.0–3.0)
Eosinophils Absolute: 0.1 10*3/uL (ref 0.0–0.7)
Eosinophils Relative: 1.6 % (ref 0.0–5.0)
HCT: 48 % (ref 39.0–52.0)
Hemoglobin: 15.8 g/dL (ref 13.0–17.0)
Lymphocytes Relative: 22 % (ref 12.0–46.0)
Lymphs Abs: 1.4 10*3/uL (ref 0.7–4.0)
MCHC: 32.9 g/dL (ref 30.0–36.0)
MCV: 83.4 fL (ref 78.0–100.0)
Monocytes Absolute: 0.7 10*3/uL (ref 0.1–1.0)
Monocytes Relative: 10.7 % (ref 3.0–12.0)
Neutro Abs: 4.1 10*3/uL (ref 1.4–7.7)
Neutrophils Relative %: 65 % (ref 43.0–77.0)
Platelets: 192 10*3/uL (ref 150.0–400.0)
RBC: 5.75 Mil/uL (ref 4.22–5.81)
RDW: 14.2 % (ref 11.5–15.5)
WBC: 6.4 10*3/uL (ref 4.0–10.5)

## 2024-01-24 LAB — COMPREHENSIVE METABOLIC PANEL
ALT: 49 U/L (ref 0–53)
AST: 28 U/L (ref 0–37)
Albumin: 4.7 g/dL (ref 3.5–5.2)
Alkaline Phosphatase: 59 U/L (ref 39–117)
BUN: 12 mg/dL (ref 6–23)
CO2: 27 meq/L (ref 19–32)
Calcium: 9.3 mg/dL (ref 8.4–10.5)
Chloride: 102 meq/L (ref 96–112)
Creatinine, Ser: 0.91 mg/dL (ref 0.40–1.50)
GFR: 109.91 mL/min (ref >60.00)
Glucose, Bld: 96 mg/dL (ref 70–99)
Potassium: 4.1 meq/L (ref 3.5–5.1)
Sodium: 136 meq/L (ref 135–145)
Total Bilirubin: 0.7 mg/dL (ref 0.2–1.2)
Total Protein: 7.9 g/dL (ref 6.0–8.3)

## 2024-01-24 LAB — T4, FREE: Free T4: 0.97 ng/dL (ref 0.60–1.60)

## 2024-01-24 LAB — LIPID PANEL
Cholesterol: 175 mg/dL (ref 0–200)
HDL: 49.3 mg/dL (ref >39.00)
LDL Cholesterol: 109 mg/dL — ABNORMAL HIGH (ref 0–99)
NonHDL: 126.08
Total CHOL/HDL Ratio: 4
Triglycerides: 84 mg/dL (ref 0.0–149.0)
VLDL: 16.8 mg/dL (ref 0.0–40.0)

## 2024-01-24 LAB — VITAMIN D 25 HYDROXY (VIT D DEFICIENCY, FRACTURES): VITD: 15.56 ng/mL — ABNORMAL LOW (ref 30.00–100.00)

## 2024-01-24 LAB — VITAMIN B12: Vitamin B-12: 640 pg/mL (ref 211–911)

## 2024-01-24 LAB — TSH: TSH: 0.82 u[IU]/mL (ref 0.35–5.50)

## 2024-01-24 MED ORDER — AMLODIPINE BESYLATE 5 MG PO TABS: 7.5000 mg | ORAL_TABLET | Freq: Every day | ORAL | 3 refills | Status: AC

## 2024-01-24 MED ORDER — HYDROCORTISONE ACETATE 25 MG RE SUPP: 25.0000 mg | Freq: Two times a day (BID) | RECTAL | 0 refills | Status: DC

## 2024-01-24 NOTE — Progress Notes (Signed)
 Established Patient Office Visit   Subjective  Patient ID: Joseph Burns, male    DOB: 1991/02/14  Age: 33 y.o. MRN: [id number]  Chief Complaint  Patient presents with   Annual Exam   Chest Pain    Tightness happened a month ago     Patient is a 33 year old male seen for CPE.  Patient states BP at home elevated 130s-140/upper 80s-90s.  Taking Norvasc 5 mg daily.  Patient endorses increased stress at work and at home.  May wake up around 2 or 3 am and have difficulty falling back asleep.  Had an episode of chest tightness 1 mo ago while at work. Felt like he needed to stretch for relief.  Also notes mild acid reflux at time of chest tightness.   Does not recall additional details such as foods eaten, increase in activity, etc.  Pt inquires about a "full vitamin panel".  Pt still having episodes of bleeding due to hemorrhoids.  Was unable to pick up rx for suppository as it was not covered by insurance.  Pt with L lateral knee pain x 2 months after working out.  Occurs with kneeling.  Denies instability.  Tried ice and heat.    Patient Active Problem List   Diagnosis Date Noted   Ingrown toenail of left foot 09/07/2020   Essential hypertension 06/06/2020   Myopia 04/24/2019   Other acne 04/24/2019   Pelvic pain 12/09/2018   Testicular discomfort 02/28/2017   Elevated blood pressure reading 02/28/2017   Chronic bilateral low back pain without sciatica 02/28/2017   Dysfunction of eustachian tube 02/28/2017   Class 1 obesity due to excess calories with body mass index (BMI) of 32.0 to 32.9 in adult 02/28/2017   History reviewed. No pertinent past medical history. Past Surgical History:  Procedure Laterality Date   HIP FRACTURE SURGERY Right    2010   wrist surgery Left    2006   Social History   Tobacco Use   Smoking status: Some Days   Smokeless tobacco: Never   Tobacco comments:    Pt reports he smokes marijuana sometimes in the weekend. 02/08/2023 -km,cma  Substance Use  Topics   Alcohol use: Yes    Comment: 6 cans of beer weekly on average   Drug use: Yes    Frequency: 2.0 times per week    Types: Marijuana   Family History  Problem Relation Age of Onset   Diabetes Mother    Hypertension Mother    Diabetes Maternal Grandmother    Cancer Maternal Grandfather 81       colon   Pancreatic cancer Maternal Grandfather    Breast cancer Paternal Grandmother    Cancer Paternal Grandfather    Cirrhosis Father        2015   Stomach cancer Other    Breast cancer Maternal Aunt    Colon cancer Neg Hx    No Known Allergies    ROS Negative unless stated above    Objective:     BP (!) 134/100 (BP Location: Left Arm, Patient Position: Sitting, Cuff Size: Large)   Pulse 84   Temp 98.2 F (36.8 C) (Oral)   Ht 5' 7.32" (1.71 m)   Wt 217 lb 6.4 oz (98.6 kg)   SpO2 97%   BMI 33.73 kg/m  BP Readings from Last 3 Encounters:  01/24/24 (!) 134/100  02/08/23 130/86  01/10/23 (!) 132/90   Wt Readings from Last 3 Encounters:  01/24/24 217 lb 6.4 oz (  98.6 kg)  02/08/23 209 lb 12.8 oz (95.2 kg)  01/10/23 212 lb 6.4 oz (96.3 kg)      Physical Exam Constitutional:      Appearance: Normal appearance.  HENT:     Head: Normocephalic and atraumatic.     Right Ear: Tympanic membrane, ear canal and external ear normal.     Left Ear: Tympanic membrane, ear canal and external ear normal.     Nose: Nose normal.     Mouth/Throat:     Mouth: Mucous membranes are moist.     Pharynx: No oropharyngeal exudate or posterior oropharyngeal erythema.  Eyes:     General: No scleral icterus.    Extraocular Movements: Extraocular movements intact.     Conjunctiva/sclera: Conjunctivae normal.     Pupils: Pupils are equal, round, and reactive to light.  Neck:     Thyroid: No thyromegaly.  Cardiovascular:     Rate and Rhythm: Regular rhythm.     Pulses: Normal pulses.     Heart sounds: Normal heart sounds. No murmur heard.    No friction rub.  Pulmonary:      Effort: Pulmonary effort is normal.     Breath sounds: Normal breath sounds. No wheezing, rhonchi or rales.  Abdominal:     General: Bowel sounds are normal.     Palpations: Abdomen is soft.     Tenderness: There is no abdominal tenderness.  Musculoskeletal:        General: No deformity. Normal range of motion.     Right knee: Normal.     Left knee: Tenderness present.       Legs:  Lymphadenopathy:     Cervical: No cervical adenopathy.  Skin:    General: Skin is warm and dry.     Findings: No lesion.  Neurological:     General: No focal deficit present.     Mental Status: He is alert and oriented to person, place, and time.  Psychiatric:        Mood and Affect: Mood normal.        Thought Content: Thought content normal.      No results found for any visits on 01/24/24.    Assessment & Plan:  Well adult exam -Age-appropriate health screenings discussed -Obtain labs.  Of note question of DM as hemoglobin A1c 6.5% several years ago with subsequent readings normal.  Last A1c 4.8% on 01/10/2023.  Monitor closely. -Next CPE in 1 year -     CBC with Differential/Platelet; Future -     Comprehensive metabolic panel; Future -     Hemoglobin A1c; Future -     Lipid panel; Future -     TSH; Future -     T4, free  Essential hypertension -Uncontrolled -Given continued elevation in BP patient again advised on need to adjust medication dose.  Now open to this idea. -Will increase Norvasc from 5 mg daily to 7.5 mg daily -Continue lifestyle modifications including reducing of sodium intake and decreasing stress -Patient encouraged to monitor BP at home and bring a log of readings to clinic. -     Comprehensive metabolic panel; Future -     TSH; Future -     T4, free -     amLODIPine Besylate; Take 1.5 tablets (7.5 mg total) by mouth daily.  Dispense: 135 tablet; Refill: 3  Vitamin D deficiency -     VITAMIN D 25 Hydroxy (Vit-D Deficiency, Fractures); Future  Anxiety about  health  Bursitis  of other bursa of left knee line -supportive care including rest, compression, topical analgesics, Tylenol.  Would limit NSAID use due to HTN. -Consider referral to sports med or Ortho as needed. -     CBC with Differential/Platelet; Future  Class 1 obesity with serious comorbidity and body mass index (BMI) of 33.0 to 33.9 in adult, unspecified obesity type -Body mass index is 33.73 kg/m. -Lifestyle modification strongly encouraged -     Hemoglobin A1c; Future -     Lipid panel; Future -     Vitamin B12; Future -     VITAMIN D 25 Hydroxy (Vit-D Deficiency, Fractures); Future  Chest tightness -resolved -Possible causes include GERD, anxiety, muscle strain  Heart burn -Avoid foods known to cause symptoms -     Ambulatory referral to Gastroenterology  Hemorrhoids, unspecified hemorrhoid type -Continued symptoms with intermittent BRBPR -Proctosol previously not covered by insurance. -Will try Anusol. -Discussed OTC hemorrhoid medications and sitz bath's -Referral to GI -     CBC with Differential/Platelet; Future -     Hydrocortisone Acetate; Place 1 suppository (25 mg total) rectally 2 (two) times daily.  Dispense: 12 suppository; Refill: 0 -     Ambulatory referral to Gastroenterology  Routine screening for STI (sexually transmitted infection) -     HIV Antibody (routine testing w rflx) -     RPR -     C. trachomatis/N. gonorrhoeae RNA    Return in about 2 months (around 03/23/2024) for blood pressure.   Deeann Saint, MD

## 2024-01-25 LAB — HEMOGLOBIN A1C
Est. average glucose Bld gHb Est-mCnc: 88 mg/dL
Hgb A1c MFr Bld: 4.7 % — ABNORMAL LOW (ref 4.8–5.6)

## 2024-01-25 LAB — HIV ANTIBODY (ROUTINE TESTING W REFLEX): HIV 1&2 Ab, 4th Generation: NONREACTIVE

## 2024-01-25 LAB — C. TRACHOMATIS/N. GONORRHOEAE RNA
C. trachomatis RNA, TMA: NOT DETECTED
N. gonorrhoeae RNA, TMA: NOT DETECTED

## 2024-01-25 LAB — RPR: RPR Ser Ql: NONREACTIVE

## 2024-01-27 ENCOUNTER — Encounter: Payer: Self-pay | Admitting: Family Medicine

## 2024-01-29 ENCOUNTER — Encounter: Payer: Self-pay | Admitting: Family Medicine

## 2024-01-31 ENCOUNTER — Telehealth: Payer: Self-pay | Admitting: Family Medicine

## 2024-01-31 ENCOUNTER — Ambulatory Visit: Payer: Self-pay | Admitting: Family Medicine

## 2024-01-31 NOTE — Telephone Encounter (Signed)
 The generic was sent to the pharmacy.  Patient previously advised that his insurance company may not cover the medication as they denied it in the past.  Patient was also advised that he could take over-the-counter medications such as Preparation H, Tucks, or sitz bath's.  Referral to GI was placed during last visit.  Patient sent several messages on 3/5 regarding labs and use of over-the-counter supplements but this is the first message regarding hydrocortisone suppositories.  Patient can check with his insurance company to see what if anything else they will cover prescription wise.

## 2024-01-31 NOTE — Telephone Encounter (Signed)
 This RN made third attempt to call back, call cannot be completed as dial, check your directory.

## 2024-01-31 NOTE — Telephone Encounter (Signed)
 2nd attempt call back, call cannot be completed as dial, check your directory.

## 2024-01-31 NOTE — Telephone Encounter (Signed)
 Copied from CRM [id number]. Topic: Clinical - Medication Question >> Jan 31, 2024  3:06 PM [place] J wrote: Reason for CRM: Insurance does not cover patient's hydrocortisone (ANUSOL-HC) 25 MG. Patient did request that a generic brand be sent in but has received zero communication back on status.

## 2024-01-31 NOTE — Telephone Encounter (Signed)
 Attempted to call patient, "call not completed as dialed.   Copied from CRM [id number]. Topic: Clinical - Lab/Test Results >> Jan 31, 2024  3:03 PM [place] J wrote: Reason for CRM: Patient would like to speak with something in deeper detail about his lab results. Patient is concerned about his cholesterol levels and would like to know plans of action for the future. He also stated that he never received an update regarding a medication to help with his low vitamin D levels and has stated that he's experienced that before and needs to know what to do from here.

## 2024-02-03 ENCOUNTER — Other Ambulatory Visit: Payer: Self-pay | Admitting: Family Medicine

## 2024-02-03 DIAGNOSIS — E559 Vitamin D deficiency, unspecified: Secondary | ICD-10-CM

## 2024-02-03 MED ORDER — VITAMIN D (ERGOCALCIFEROL) 1.25 MG (50000 UNIT) PO CAPS: 50000.0000 [IU] | ORAL_CAPSULE | ORAL | 0 refills | Status: AC

## 2024-02-03 NOTE — Telephone Encounter (Signed)
 Called patient was unable to leave a VM, will call patient back at a different time

## 2024-03-23 ENCOUNTER — Encounter: Payer: Self-pay | Admitting: Family Medicine

## 2024-03-23 ENCOUNTER — Ambulatory Visit: Payer: BC Managed Care – PPO | Admitting: Family Medicine

## 2024-03-23 VITALS — BP 132/84 | HR 63 | Temp 98.8°F | Ht 67.32 in | Wt 217.6 lb

## 2024-03-23 DIAGNOSIS — I10 Essential (primary) hypertension: Secondary | ICD-10-CM

## 2024-03-23 DIAGNOSIS — K649 Unspecified hemorrhoids: Secondary | ICD-10-CM

## 2024-03-23 NOTE — Patient Instructions (Addendum)
 It appears that the gastroenterologist tried to contact you twice once via phone on 02/21/2024 and via MyChart on 03/18/2024.  You can contact their office in regards to setting up an appointment.

## 2024-03-23 NOTE — Progress Notes (Signed)
 Established Patient Office Visit   Subjective  Patient ID: Joseph Burns, male    DOB: [birth year]  Age: 33 y.o. MRN: [id number]  Chief Complaint  Patient presents with   Follow-up    Blood pressure, patient is not check BP at home     Patient is a 33 year old male seen for follow-up on BP.  Pt just got a bp cuff.  Has checked BP a few times. States was in the 120s-130/mid 80s. Taking Norvasc  7.5 mg daily.  Pt has not heard anything about the GI referral placed 01/24/24.  Still having issues with hemorrhoids causing occasional blood on toilet paper.  Patient also mentions having the chest discomfort recently while "out and about".  Patient does not recall exactly what he was doing he might have been in the store.  States hard to describe sensation but it is not a chest pain.  Occurs on left side of chest feels like may need to stretch.  Unsure if stretching actually helps relieve the sensation.  Sensation may last a few minutes.  Patient does not drink caffeine.  Patient inquires why his vitamin d  has been low over the last few years.  Inquires if there is anything he can do other than take over-the-counter vitamin D  to help improve readings.  Also inquires if there is something wrong that is causing low vit D.    Patient Active Problem List   Diagnosis Date Noted   Ingrown toenail of left foot 09/07/2020   Essential hypertension 06/06/2020   Myopia 04/24/2019   Other acne 04/24/2019   Pelvic pain 12/09/2018   Testicular discomfort 02/28/2017   Elevated blood pressure reading 02/28/2017   Chronic bilateral low back pain without sciatica 02/28/2017   Dysfunction of eustachian tube 02/28/2017   Class 1 obesity due to excess calories with body mass index (BMI) of 32.0 to 32.9 in adult 02/28/2017   History reviewed. No pertinent past medical history. Past Surgical History:  Procedure Laterality Date   HIP FRACTURE SURGERY Right    2010   wrist surgery Left    2006   Social History    Tobacco Use   Smoking status: Some Days   Smokeless tobacco: Never   Tobacco comments:    Pt reports he smokes marijuana sometimes in the weekend. 02/08/2023 -km,cma  Substance Use Topics   Alcohol use: Yes    Comment: 6 cans of beer weekly on average   Drug use: Yes    Frequency: 2.0 times per week    Types: Marijuana   Family History  Problem Relation Age of Onset   Diabetes Mother    Hypertension Mother    Diabetes Maternal Grandmother    Cancer Maternal Grandfather 70       colon   Pancreatic cancer Maternal Grandfather    Breast cancer Paternal Grandmother    Cancer Paternal Grandfather    Cirrhosis Father        2015   Stomach cancer Other    Breast cancer Maternal Aunt    Colon cancer Neg Hx    No Known Allergies    ROS Negative unless stated above    Objective:     BP 132/84 (BP Location: Left Arm, Patient Position: Sitting, Cuff Size: Normal)   Pulse 63   Temp 98.8 F (37.1 C) (Oral)   Ht 5' 7.32" (1.71 m)   Wt 217 lb 9.6 oz (98.7 kg)   SpO2 97%   BMI 33.76 kg/m  BP Readings  from Last 3 Encounters:  03/23/24 132/84  01/24/24 (!) 134/100  02/08/23 130/86   Wt Readings from Last 3 Encounters:  03/23/24 217 lb 9.6 oz (98.7 kg)  01/24/24 217 lb 6.4 oz (98.6 kg)  02/08/23 209 lb 12.8 oz (95.2 kg)      Physical Exam Constitutional:      General: He is not in acute distress.    Appearance: Normal appearance.  HENT:     Head: Normocephalic and atraumatic.     Nose: Nose normal.     Mouth/Throat:     Mouth: Mucous membranes are moist.  Cardiovascular:     Rate and Rhythm: Normal rate and regular rhythm.     Heart sounds: Normal heart sounds. No murmur heard.    No friction rub. No gallop.  Pulmonary:     Effort: Pulmonary effort is normal. No respiratory distress.     Breath sounds: Normal breath sounds. No wheezing, rhonchi or rales.  Skin:    General: Skin is warm and dry.  Neurological:     Mental Status: He is alert and oriented to  person, place, and time.     No results found for any visits on 03/23/24.    Assessment & Plan:  Essential hypertension  Hemorrhoids, unspecified hemorrhoid type   BP controlled.  Continue Norvasc  7.5 mg daily.  Continue lifestyle modifications.  Patient encouraged to monitor BP at home and keep log of readings to bring to clinic now that he has a BP cuff.  Per chart review GI made at attempt(s) to contact patient.  Advised to contact GI to schedule appointment.  Given info on vitamin D .  Patient encouraged to monitor chest discomfort symptoms and write down any details that may be helpful to help guide evaluation.  Return if symptoms worsen or fail to improve.   Viola Greulich, MD

## 2024-03-30 ENCOUNTER — Telehealth: Admitting: Physician Assistant

## 2024-03-30 ENCOUNTER — Ambulatory Visit: Admitting: Family Medicine

## 2024-03-30 ENCOUNTER — Encounter: Payer: Self-pay | Admitting: Family Medicine

## 2024-03-30 VITALS — BP 160/80 | HR 78 | Temp 99.1°F | Ht 67.32 in | Wt 217.4 lb

## 2024-03-30 DIAGNOSIS — I10 Essential (primary) hypertension: Secondary | ICD-10-CM | POA: Diagnosis not present

## 2024-03-30 DIAGNOSIS — L720 Epidermal cyst: Secondary | ICD-10-CM | POA: Diagnosis not present

## 2024-03-30 DIAGNOSIS — D234 Other benign neoplasm of skin of scalp and neck: Secondary | ICD-10-CM

## 2024-03-30 NOTE — Progress Notes (Signed)
 Because of needing a referral and the Virtual Urgent Care team being unable to place referrals, I feel your condition warrants further evaluation and I recommend that you be seen for a face to face visit.  Please contact your primary care physician practice to be seen. Many offices offer virtual options to be seen via video if you are not comfortable going in person to a medical facility at this time.  NOTE: You will NOT be charged for this eVisit.  If you do not have a PCP, [facility] offers a free physician referral service available at [phone]. Our trained staff has the experience, knowledge and resources to put you in touch with a physician who is right for you.    If you are having a true medical emergency please call 911.   Your e-visit answers were reviewed by a board certified advanced clinical practitioner to complete your personal care plan.  Thank you for using e-Visits.   I have spent 5 minutes in review of e-visit questionnaire, review and updating patient chart, medical decision making and response to patient.   Angelia Kelp, PA-C

## 2024-03-30 NOTE — Progress Notes (Signed)
 Established Patient Office Visit   Subjective  Patient ID: Joseph Burns, male    DOB: 1991/07/13  Age: 33 y.o. MRN: [id number]  Chief Complaint  Patient presents with   Cyst    On the back of the neck started 2 years ago, sore, patient would like a referral to dermatology     Patient is a 33 year old male seen for ongoing concern.  Patient with a bump in the posterior scalp x 2 yrs that has become sore. Denies drainage, fever, chills.  Patient would like to see Dr. Louana Roup if possible.  Per chart review it appears patient attempted to make a virtual visit earlier today for same problem however was advised needed to be seen by PCP for referral to Derm.  BP elevated.  Patient states he took medication this morning.  Not currently checking BP consistently.    Patient Active Problem List   Diagnosis Date Noted   Ingrown toenail of left foot 09/07/2020   Essential hypertension 06/06/2020   Myopia 04/24/2019   Other acne 04/24/2019   Pelvic pain 12/09/2018   Testicular discomfort 02/28/2017   Elevated blood pressure reading 02/28/2017   Chronic bilateral low back pain without sciatica 02/28/2017   Dysfunction of eustachian tube 02/28/2017   Class 1 obesity due to excess calories with body mass index (BMI) of 32.0 to 32.9 in adult 02/28/2017   History reviewed. No pertinent past medical history. Past Surgical History:  Procedure Laterality Date   HIP FRACTURE SURGERY Right    2010   wrist surgery Left    2006   Social History   Tobacco Use   Smoking status: Some Days   Smokeless tobacco: Never   Tobacco comments:    Pt reports he smokes marijuana sometimes in the weekend. 02/08/2023 -km,cma  Substance Use Topics   Alcohol use: Yes    Comment: 6 cans of beer weekly on average   Drug use: Yes    Frequency: 2.0 times per week    Types: Marijuana   Family History  Problem Relation Age of Onset   Diabetes Mother    Hypertension Mother    Diabetes Maternal  Grandmother    Cancer Maternal Grandfather 73       colon   Pancreatic cancer Maternal Grandfather    Breast cancer Paternal Grandmother    Cancer Paternal Grandfather    Cirrhosis Father        2015   Stomach cancer Other    Breast cancer Maternal Aunt    Colon cancer Neg Hx    No Known Allergies    ROS Negative unless stated above    Objective:     BP (!) 160/80 (BP Location: Left Arm, Patient Position: Sitting, Cuff Size: Large)   Pulse 78   Temp 99.1 F (37.3 C) (Oral)   Ht 5' 7.32" (1.71 m)   Wt 217 lb 6.4 oz (98.6 kg)   SpO2 97%   BMI 33.73 kg/m  BP Readings from Last 3 Encounters:  03/30/24 (!) 160/80  03/23/24 132/84  01/24/24 (!) 134/100   Wt Readings from Last 3 Encounters:  03/30/24 217 lb 6.4 oz (98.6 kg)  03/23/24 217 lb 9.6 oz (98.7 kg)  01/24/24 217 lb 6.4 oz (98.6 kg)      Physical Exam Constitutional:      Appearance: Normal appearance.  HENT:     Head: Normocephalic and atraumatic.     Nose: Nose normal.  Pulmonary:     Effort:  Pulmonary effort is normal.  Skin:    General: Skin is warm and dry.     Comments: A 1 cm cystic like nodule on posterior scalp at inferior occipital area near hairline with mild erythema surrounding.  Clogged pore noted.  No drainage.  Mild TTP.  Neurological:     Mental Status: He is alert and oriented to person, place, and time.        03/23/2024   10:17 AM 01/24/2024   11:06 AM 02/08/2023   10:45 AM  Depression screen PHQ 2/9  Decreased Interest 0 0 0  Down, Depressed, Hopeless 1 1 1   PHQ - 2 Score 1 1 1   Altered sleeping 0 1 1  Tired, decreased energy 1  2  Change in appetite 0 0 1  Feeling bad or failure about yourself  1 1 1   Trouble concentrating 0 1 0  Moving slowly or fidgety/restless 0 0 0  Suicidal thoughts 0 0 0  PHQ-9 Score 3 4 6   Difficult doing work/chores Not difficult at all Somewhat difficult Somewhat difficult      03/23/2024   10:17 AM 01/24/2024   11:06 AM 02/08/2023   10:45 AM  09/14/2021   10:22 AM  GAD 7 : Generalized Anxiety Score  Nervous, Anxious, on Edge 0 0 1 0  Control/stop worrying 0 0 1 1  Worry too much - different things 1 1 1  0  Trouble relaxing 0 1 1 1   Restless 0 0 0 0  Easily annoyed or irritable 1 1 1 1   Afraid - awful might happen 0 0 0 0  Total GAD 7 Score 2 3 5 3   Anxiety Difficulty Not difficult at all Somewhat difficult Somewhat difficult Not difficult at all     No results found for any visits on 03/30/24.    Assessment & Plan:  Epidermoid cyst of skin of scalp -     Ambulatory referral to Dermatology  Essential hypertension  Referral to dermatology for excision of epidermoid cyst of scalp.  BP uncontrolled.  BP recheck remains elevated.  Patient endorses compliance.  Declines medication adjustment at this time.  Continue Norvasc  5 mg daily.  Advised to check BP daily and keep a log to bring with him to clinic.  Continue lifestyle modifications.  Return if symptoms worsen or fail to improve.   Viola Greulich, MD

## 2024-03-31 ENCOUNTER — Telehealth: Payer: Self-pay | Admitting: Family Medicine

## 2024-03-31 NOTE — Telephone Encounter (Signed)
 Copied from CRM [id number]. Topic: Referral - Question >> Mar 31, 2024 12:22 PM Albertha Alosa wrote: Reason for CRM: [facility] called in stated that patent needs the referral to be resent to them because the office that it was sent to they do not accept patients insurance

## 2024-04-02 ENCOUNTER — Encounter: Payer: Self-pay | Admitting: Dermatology

## 2024-04-02 ENCOUNTER — Ambulatory Visit: Admitting: Dermatology

## 2024-04-02 VITALS — BP 151/91

## 2024-04-02 DIAGNOSIS — D485 Neoplasm of uncertain behavior of skin: Secondary | ICD-10-CM

## 2024-04-02 DIAGNOSIS — R220 Localized swelling, mass and lump, head: Secondary | ICD-10-CM | POA: Diagnosis not present

## 2024-04-02 DIAGNOSIS — R229 Localized swelling, mass and lump, unspecified: Secondary | ICD-10-CM

## 2024-04-02 NOTE — Patient Instructions (Signed)

## 2024-04-02 NOTE — Progress Notes (Signed)
   New Patient Visit   Subjective  Joseph Burns is a 33 y.o. male who presents for the following: Spot of post scalp x 2 years that has recently gotten bigger. Lesion has never drained but has been tender at time. Not previously treated.   The following portions of the chart were reviewed this encounter and updated as appropriate: medications, allergies, medical history  Review of Systems:  No other skin or systemic complaints except as noted in HPI or Assessment and Plan.  Objective  Well appearing patient in no apparent distress; mood and affect are within normal limits.   A focused examination was performed of the following areas: Post scalp  Relevant exam findings are noted in the Assessment and Plan.    Scalp 4.0 x 3.5 cm subcutaneous nodule with central punctum  Assessment & Plan   Subcutaneous Nodule- Likely EIC vs pilar cyst  Exam: Subcutaneous nodule at posterior nape of neck/scalp  Benign-appearing. Exam most consistent with an epidermal inclusion cyst. Discussed that a cyst is a benign growth that can grow over time and sometimes get irritated or inflamed. Recommend observation if it is not bothersome. Discussed option of surgical excision to remove it if it is growing, symptomatic, or other changes noted. Patient would like to proceed with cyst excision and will schedule  NEOPLASM OF UNCERTAIN BEHAVIOR OF SKIN Scalp Will plan excision on follow up. SUBCUTANEOUS NODULE    Return for Surgery Cyst vs othe rof post scalp.  I, Eliot Guernsey, CMA, am acting as scribe for Deneise Finlay, MD .   Documentation: I have reviewed the above documentation for accuracy and completeness, and I agree with the above.  Deneise Finlay, MD

## 2024-04-06 ENCOUNTER — Other Ambulatory Visit: Payer: Self-pay | Admitting: Dermatology

## 2024-04-06 MED ORDER — DOXYCYCLINE HYCLATE 100 MG PO TABS: 100.0000 mg | ORAL_TABLET | Freq: Two times a day (BID) | ORAL | 0 refills | Status: AC

## 2024-04-06 NOTE — Progress Notes (Signed)
 Patient has signs and symptoms of an infection

## 2024-04-23 ENCOUNTER — Encounter: Payer: Self-pay | Admitting: Dermatology

## 2024-04-24 ENCOUNTER — Other Ambulatory Visit: Payer: Self-pay | Admitting: Family Medicine

## 2024-04-24 DIAGNOSIS — E559 Vitamin D deficiency, unspecified: Secondary | ICD-10-CM

## 2024-04-28 ENCOUNTER — Ambulatory Visit: Admitting: Dermatology

## 2024-04-28 ENCOUNTER — Encounter: Payer: Self-pay | Admitting: Dermatology

## 2024-04-28 VITALS — BP 142/98 | HR 86 | Temp 98.5°F

## 2024-04-28 DIAGNOSIS — D485 Neoplasm of uncertain behavior of skin: Secondary | ICD-10-CM

## 2024-04-28 DIAGNOSIS — D492 Neoplasm of unspecified behavior of bone, soft tissue, and skin: Secondary | ICD-10-CM

## 2024-04-28 DIAGNOSIS — L720 Epidermal cyst: Secondary | ICD-10-CM

## 2024-04-28 NOTE — Patient Instructions (Signed)

## 2024-04-28 NOTE — Progress Notes (Signed)
 Follow-Up Visit   Subjective  Joseph Burns is a 33 y.o. male who presents for the following: Excision of a neoplasm of skin on the scalp, which got less painful and went down in size after doxycycline .   The following portions of the chart were reviewed this encounter and updated as appropriate: medications, allergies, medical history  Review of Systems:  No other skin or systemic complaints except as noted in HPI or Assessment and Plan.  Objective  Well appearing patient in no apparent distress; mood and affect are within normal limits.  A focused examination was performed of the following areas: scalp Relevant physical exam findings are noted in the Assessment and Plan.   scalp 2.4 x 1.8 cm subcutaneous nodule with central punctum    Assessment & Plan   NEOPLASM OF UNCERTAIN BEHAVIOR OF SKIN scalp Skin excision  Excision method:  elliptical Lesion length (cm):  2.4 Lesion width (cm):  1.8 Margin per side (cm):  0.1 Total excision diameter (cm):  2.6 Informed consent: discussed and consent obtained   Timeout: patient name, date of birth, surgical site, and procedure verified   Procedure prep:  Patient was prepped and draped in usual sterile fashion Prep type:  Chlorhexidine Anesthesia: the lesion was anesthetized in a standard fashion   Anesthetic:  1% lidocaine w/ epinephrine 1-100,000 buffered w/ 8.4% NaHCO3 Instrument used: #15 blade   Hemostasis achieved with: suture, pressure and electrodesiccation   Outcome: patient tolerated procedure well with no complications   Post-procedure details: sterile dressing applied and wound care instructions given   Dressing type: petrolatum and pressure dressing    Skin repair Complexity:  Complex Final length (cm):  3.2 Informed consent: discussed and consent obtained   Timeout: patient name, date of birth, surgical site, and procedure verified   Procedure prep:  Patient was prepped and draped in usual sterile fashion Prep  type:  Chlorhexidine Anesthesia: the lesion was anesthetized in a standard fashion   Anesthetic:  1% lidocaine w/ epinephrine 1-100,000 buffered w/ 8.4% NaHCO3 Reason for type of repair: reduce tension to allow closure and preserve normal anatomical and functional relationships   Undermining: area extensively undermined   Subcutaneous layers (deep stitches):  Suture size:  3-0 Suture type: Vicryl Rapide (coated polyglactin 910)   Stitches:  Buried vertical mattress Fine/surface layer approximation (top stitches):  Suture size:  6-0 Suture type: fast-absorbing plain gut   Stitches: simple running   Hemostasis achieved with: suture, pressure and electrodesiccation Outcome: patient tolerated procedure well with no complications   Post-procedure details: sterile dressing applied and wound care instructions given   Dressing type: petrolatum and pressure dressing   Specimen 1 - Surgical pathology Differential Diagnosis: R/O cyst vs lipoma vs other  Check Margins: No  The surgical wound was then cleaned, prepped, and re-anesthetized as above. Wound edges were undermined extensively along at least one entire edge and at a distance equal to or greater than the width of the defect (see wound defect size above) in order to achieve closure and decrease wound tension and anatomic distortion. Redundant tissue repair including standing cone removal was performed. Hemostasis was achieved with electrocautery. Subcutaneous and epidermal tissues were approximated with the above sutures. The surgical site was then lightly scrubbed with sterile, saline-soaked gauze. The area was then bandaged using Vaseline ointment, non-adherent gauze, gauze pads, and tape to provide an adequate pressure dressing. The patient tolerated the procedure well, was given detailed written and verbal wound care instructions, and was discharged  in good condition.   The patient will follow-up: PRN.  Return if symptoms worsen or fail to  improve.  Maurine Sovereign, RN, am acting as scribe for Deneise Finlay, MD .   Documentation: I have reviewed the above documentation for accuracy and completeness, and I agree with the above.  Deneise Finlay, MD

## 2024-04-29 LAB — SURGICAL PATHOLOGY

## 2024-04-30 ENCOUNTER — Ambulatory Visit: Payer: Self-pay | Admitting: Dermatology

## 2024-09-29 DIAGNOSIS — Z113 Encounter for screening for infections with a predominantly sexual mode of transmission: Secondary | ICD-10-CM | POA: Diagnosis not present

## 2024-09-29 DIAGNOSIS — Z114 Encounter for screening for human immunodeficiency virus [HIV]: Secondary | ICD-10-CM | POA: Diagnosis not present

## 2024-09-29 DIAGNOSIS — N341 Nonspecific urethritis: Secondary | ICD-10-CM | POA: Diagnosis not present

## 2024-11-13 ENCOUNTER — Encounter: Payer: Self-pay | Admitting: Gastroenterology

## 2024-11-18 ENCOUNTER — Telehealth (INDEPENDENT_AMBULATORY_CARE_PROVIDER_SITE_OTHER): Payer: Self-pay

## 2024-11-18 DIAGNOSIS — I10 Essential (primary) hypertension: Secondary | ICD-10-CM

## 2024-11-18 NOTE — Progress Notes (Signed)
" ° °  11/18/2024  Patient ID: Joseph Burns, male   DOB: 06/22/1991, 33 y.o.   MRN: [id number]  Pharmacy Quality Measure Review  This patient is appearing on a report for being at risk of failing the Controlling Blood Pressure measure this calendar year.   Last documented BP 130/88 on 11/18/24  Spoke with patient. Reports was recently checked at dentist and was still controlled. Discussed scheduling physical, due in March. Recommend to continue to monitor BP at home routinely.  Jon VEAR Lindau, PharmD Clinical Pharmacist [phone]    "

## 2024-12-17 NOTE — Progress Notes (Unsigned)
 "  Chief Complaint: Primary GI MD: Sampson  HPI:  *** is a  ***  who was referred to me by Mercer Clotilda SAUNDERS, MD for a complaint of *** .     Discussed the use of AI scribe software for clinical note transcription with the patient, who gave verbal consent to proceed.  History of Present Illness      PREVIOUS GI WORKUP     No past medical history on file.  Past Surgical History:  Procedure Laterality Date   HIP FRACTURE SURGERY Right    2010   wrist surgery Left    2006    Current Outpatient Medications  Medication Sig Dispense Refill   amLODipine  (NORVASC ) 5 MG tablet Take 1.5 tablets (7.5 mg total) by mouth daily. 135 tablet 3   Multiple Vitamins-Minerals (MULTIVITAMIN WITH MINERALS) tablet Take 1 tablet by mouth daily. 90 tablet 3   Vitamin D , Ergocalciferol , (DRISDOL ) 1.25 MG (50000 UNIT) CAPS capsule Take 1 capsule (50,000 Units total) by mouth every 7 (seven) days. 12 capsule 0   No current facility-administered medications for this visit.    Allergies as of 12/18/2024   (No Known Allergies)    Family History  Problem Relation Age of Onset   Diabetes Mother    Hypertension Mother    Diabetes Maternal Grandmother    Cancer Maternal Grandfather 82       colon   Pancreatic cancer Maternal Grandfather    Breast cancer Paternal Grandmother    Cancer Paternal Grandfather    Cirrhosis Father        2015   Stomach cancer Other    Breast cancer Maternal Aunt    Colon cancer Neg Hx     Social History   Socioeconomic History   Marital status: Single    Spouse name: Not on file   Number of children: Not on file   Years of education: Not on file   Highest education level: Not on file  Occupational History   Not on file  Tobacco Use   Smoking status: Some Days   Smokeless tobacco: Never   Tobacco comments:    Pt reports he smokes marijuana sometimes in the weekend. 02/08/2023 -km,cma  Substance and Sexual Activity   Alcohol use: Yes    Comment: 6  cans of beer weekly on average   Drug use: Yes    Frequency: 2.0 times per week    Types: Marijuana   Sexual activity: Yes    Partners: Female  Other Topics Concern   Not on file  Social History Narrative   [employer] [employer]    Lives in [place]   Single   No children   Fun: movies, games, hanging out with friends   Social Drivers of Health   Tobacco Use: High Risk (04/28/2024)   Patient History    Smoking Tobacco Use: Some Days    Smokeless Tobacco Use: Never    Passive Exposure: Not on file  Financial Resource Strain: Not on file  Food Insecurity: Not on file  Transportation Needs: Not on file  Physical Activity: Not on file  Stress: Not on file  Social Connections: Not on file  Intimate Partner Violence: Not on file  Depression (PHQ2-9): Low Risk (03/23/2024)   Depression (PHQ2-9)    PHQ-2 Score: 3  Alcohol Screen: Not on file  Housing: Not on file  Utilities: Not on file  Health Literacy: Not on file    Review of Systems:  Constitutional: No weight loss, fever, chills, weakness or fatigue HEENT: Eyes: No change in vision               Ears, Nose, Throat:  No change in hearing or congestion Skin: No rash or itching Cardiovascular: No chest pain, chest pressure or palpitations   Respiratory: No SOB or cough Gastrointestinal: See HPI and otherwise negative Genitourinary: No dysuria or change in urinary frequency Neurological: No headache, dizziness or syncope Musculoskeletal: No new muscle or joint pain Hematologic: No bleeding or bruising Psychiatric: No history of depression or anxiety    Physical Exam:  Vital signs: There were no vitals taken for this visit.  Constitutional: NAD, alert and cooperative Head:  Normocephalic and atraumatic. Eyes:   PEERL, EOMI. No icterus. Conjunctiva pink. Respiratory: Respirations even and unlabored. Lungs clear to auscultation bilaterally.   No wheezes, crackles, or rhonchi.  Cardiovascular:  Regular rate and rhythm. No  peripheral edema, cyanosis or pallor.  Gastrointestinal:  Soft, nondistended, nontender. No rebound or guarding. Normal bowel sounds. No appreciable masses or hepatomegaly. Rectal:  Declines Msk:  Symmetrical without gross deformities. Without edema, no deformity or joint abnormality.  Neurologic:  Alert and  oriented x4;  grossly normal neurologically.  Skin:   Dry and intact without significant lesions or rashes. Psychiatric: Oriented to person, place and time. Demonstrates good judgement and reason without abnormal affect or behaviors.  Physical Exam    RELEVANT LABS AND IMAGING: CBC    Component Value Date/Time   WBC 6.4 01/24/2024 1058   RBC 5.75 01/24/2024 1058   HGB 15.8 01/24/2024 1058   HCT 48.0 01/24/2024 1058   PLT 192.0 01/24/2024 1058   MCV 83.4 01/24/2024 1058   MCH 26.8 (L) 09/07/2020 1048   MCHC 32.9 01/24/2024 1058   RDW 14.2 01/24/2024 1058   LYMPHSABS 1.4 01/24/2024 1058   MONOABS 0.7 01/24/2024 1058   EOSABS 0.1 01/24/2024 1058   BASOSABS 0.0 01/24/2024 1058    CMP     Component Value Date/Time   NA 136 01/24/2024 1058   K 4.1 01/24/2024 1058   CL 102 01/24/2024 1058   CO2 27 01/24/2024 1058   GLUCOSE 96 01/24/2024 1058   BUN 12 01/24/2024 1058   CREATININE 0.91 01/24/2024 1058   CREATININE 0.88 09/07/2020 1048   CALCIUM 9.3 01/24/2024 1058   PROT 7.9 01/24/2024 1058   ALBUMIN 4.7 01/24/2024 1058   AST 28 01/24/2024 1058   ALT 49 01/24/2024 1058   ALKPHOS 59 01/24/2024 1058   BILITOT 0.7 01/24/2024 1058   GFRNONAA 114 09/07/2020 1048   GFRAA 133 09/07/2020 1048     Assessment/Plan:   Assessment and Plan Assessment & Plan    Hemorrhoids No anemia   [facility] 12/17/2024, 3:35 PM  Cc: Mercer Clotilda SAUNDERS, MD "

## 2024-12-18 ENCOUNTER — Ambulatory Visit: Admitting: Gastroenterology

## 2024-12-18 ENCOUNTER — Encounter: Payer: Self-pay | Admitting: Gastroenterology

## 2024-12-18 VITALS — BP 130/80 | HR 94 | Ht 67.32 in | Wt 213.0 lb

## 2024-12-18 DIAGNOSIS — K648 Other hemorrhoids: Secondary | ICD-10-CM

## 2024-12-18 DIAGNOSIS — K649 Unspecified hemorrhoids: Secondary | ICD-10-CM

## 2024-12-18 DIAGNOSIS — K625 Hemorrhage of anus and rectum: Secondary | ICD-10-CM | POA: Diagnosis not present

## 2024-12-18 MED ORDER — HYDROCORTISONE ACETATE 25 MG RE SUPP: 25.0000 mg | Freq: Two times a day (BID) | RECTAL | 0 refills | Status: AC

## 2024-12-18 MED ORDER — HYDROCORTISONE (PERIANAL) 2.5 % EX CREA: 1.0000 | TOPICAL_CREAM | Freq: Two times a day (BID) | CUTANEOUS | 1 refills | Status: AC

## 2024-12-18 NOTE — Patient Instructions (Addendum)
 A high fiber diet with plenty of fluids (up to 8 glasses of water daily) is suggested to relieve these symptoms.  Benefiber, 1 tablespoon once or twice daily can be used to keep bowels regular if needed.   If the hemorrhoid suppository sent in is too expensive you can do this over the counter trick.   Apply a pea size amount of generic prescription Anusol  HC cream that has been sent into your pharmacy to the tip of an over the counter PrepH suppository and insert rectally once every night for at least 7 nights.   VISIT SUMMARY:  During your visit, we discussed your chronic internal hemorrhoids with intermittent rectal bleeding. We reviewed your symptoms, including the discomfort and occasional need for manual assistance to reduce the hemorrhoids, as well as the intermittent rectal bleeding and anal itching. We also discussed your bowel habits and occupational activities.  YOUR PLAN:  -INTERNAL HEMORRHOIDS WITH INTERMITTENT RECTAL BLEEDING: Internal hemorrhoids are swollen veins inside the rectum that can cause discomfort, bleeding, and prolapse. We will start with conservative management, including fiber supplementation to promote regular, soft stools and reduce straining. You should take sitz baths with Epsom salts for relief and use the prescribed suppositories nightly for 7 days. If cost is an issue, you can use over-the-counter suppositories with prescription cream. Additionally, use a toilet footstool to reduce straining during bowel movements, avoid prolonged sitting on the toilet, and minimize straining. If symptoms persist, we may consider hemorrhoid banding, which requires three sessions, or a colonoscopy for further evaluation and reassurance. Please discuss any family history of colorectal cancer or polyps with your relatives. We will proceed with further treatment based on your response to conservative therapy and insurance considerations.  INSTRUCTIONS:  Please follow the conservative  management plan we discussed, including fiber supplementation, sitz baths, and the use of suppositories. Avoid prolonged sitting on the toilet and minimize straining. If your symptoms persist or worsen, we may need to consider hemorrhoid banding or a colonoscopy. Verify your insurance coverage and costs for these procedures. Discuss any family history of colorectal cancer or polyps with your relatives. Follow up with us  based on your response to the conservative therapy.   How to Take a Sitz Bath A sitz bath is a warm water bath that may be used to care for your rectum, genital area, or the area between your rectum and genitals (perineum). In a sitz bath, the water only comes up to your hips and covers your buttocks. A sitz bath may be done in a bathtub or with a portable sitz bath that fits over the toilet. Your health care provider may recommend a sitz bath to help: Relieve pain and discomfort after delivering a baby. Relieve pain and itching from hemorrhoids or anal fissures. Relieve pain after certain surgeries. Relax muscles that are sore or tight. How to take a sitz bath Take 2-4 sitz baths a day, or as many as told by your health care provider. Bathtub sitz bath To take a sitz bath in a bathtub: Partially fill a bathtub with warm water. The water should be deep enough to cover your hips and buttocks when you are sitting in the bathtub. Follow your health care provider's instructions if you are told to put medicine in the water. Sit in the water. Open the bathtub drain a little, and leave it open during your bath. Turn on the warm water again, enough to replace the water that is draining out. Keep the water running throughout your  bath. This helps keep the water at the right level and temperature. Soak in the water for 15-20 minutes, or as long as told by your health care provider. When you are done, be careful when you stand up. You may feel dizzy. After the sitz bath, pat yourself dry. Do  not rub your skin to dry it.  Over-the-toilet sitz bath To take a sitz bath with an over-the-toilet basin: Follow the manufacturer's instructions. Fill the basin with warm water. Follow your health care provider's instructions if you were told to put medicine in the water. Sit on the seat. Make sure the water covers your buttocks and perineum. Soak in the water for 15-20 minutes, or as long as told by your health care provider. After the sitz bath, pat yourself dry. Do not rub your skin to dry it. Clean and dry the basin between uses. Discard the basin if it cracks, or according to the manufacturer's instructions.  Contact a health care provider if: Your pain or itching gets worse. Stop doing sitz baths if your symptoms get worse. You have new symptoms. Stop doing sitz baths until you talk with your health care provider. Summary A sitz bath is a warm water bath in which the water only comes up to your hips and covers your buttocks. Your health care provider may recommend a sitz bath to help relieve pain and discomfort after delivering a baby, relieve pain and itching from hemorrhoids or anal fissures, relieve pain after certain surgeries, or help to relax muscles that are sore or tight. Take 2-4 sitz baths a day, or as many as told by your health care provider. Soak in the water for 15-20 minutes. Stop doing sitz baths if your symptoms get worse. This information is not intended to replace advice given to you by your health care provider. Make sure you discuss any questions you have with your health care provider. Document Revised: 02/13/2022 Document Reviewed: 02/13/2022 Elsevier Patient Education  2024 [employer].

## 2024-12-20 NOTE — Progress Notes (Signed)
 Agree with the assessment and plan as outlined by Boone Master, PA-C.

## 2025-01-27 ENCOUNTER — Encounter: Admitting: Family Medicine

## 2025-02-12 ENCOUNTER — Ambulatory Visit: Admitting: Gastroenterology
